# Patient Record
Sex: Female | Born: 1970
Health system: Southern US, Community
[De-identification: ages and names within clinical notes are randomized; demographics above are authoritative.]

## PROBLEM LIST (undated history)

## (undated) DIAGNOSIS — E119 Type 2 diabetes mellitus without complications: Secondary | ICD-10-CM

## (undated) DIAGNOSIS — K8020 Calculus of gallbladder without cholecystitis without obstruction: Secondary | ICD-10-CM

## (undated) DIAGNOSIS — I10 Essential (primary) hypertension: Secondary | ICD-10-CM

## (undated) DIAGNOSIS — G4730 Sleep apnea, unspecified: Secondary | ICD-10-CM

## (undated) DIAGNOSIS — E669 Obesity, unspecified: Secondary | ICD-10-CM

## (undated) DIAGNOSIS — K760 Fatty (change of) liver, not elsewhere classified: Secondary | ICD-10-CM

## (undated) DIAGNOSIS — K219 Gastro-esophageal reflux disease without esophagitis: Secondary | ICD-10-CM

## (undated) DIAGNOSIS — Z803 Family history of malignant neoplasm of breast: Secondary | ICD-10-CM

## (undated) DIAGNOSIS — G4733 Obstructive sleep apnea (adult) (pediatric): Secondary | ICD-10-CM

## (undated) DIAGNOSIS — G43109 Migraine with aura, not intractable, without status migrainosus: Secondary | ICD-10-CM

## (undated) DIAGNOSIS — F419 Anxiety disorder, unspecified: Secondary | ICD-10-CM

## (undated) DIAGNOSIS — J309 Allergic rhinitis, unspecified: Secondary | ICD-10-CM

## (undated) DIAGNOSIS — E7800 Pure hypercholesterolemia, unspecified: Secondary | ICD-10-CM

## (undated) DIAGNOSIS — Z8489 Family history of other specified conditions: Secondary | ICD-10-CM

## (undated) HISTORY — DX: Migraine with aura, not intractable, without status migrainosus: G43.109

## (undated) HISTORY — DX: Obesity, unspecified: E66.9

## (undated) HISTORY — DX: Pure hypercholesterolemia, unspecified: E78.00

## (undated) HISTORY — DX: Type 2 diabetes mellitus without complications: E11.9

## (undated) HISTORY — DX: Family history of malignant neoplasm of breast: Z80.3

## (undated) HISTORY — PX: NASAL SEPTUM SURGERY: SHX37

## (undated) HISTORY — DX: Allergic rhinitis, unspecified: J30.9

## (undated) HISTORY — DX: Obstructive sleep apnea (adult) (pediatric): G47.33

## (undated) HISTORY — DX: Fatty (change of) liver, not elsewhere classified: K76.0

---

## 2002-06-17 ENCOUNTER — Encounter: Payer: Self-pay | Admitting: Family Medicine

## 2002-06-17 ENCOUNTER — Encounter: Admission: RE | Admit: 2002-06-17 | Discharge: 2002-06-17 | Payer: Self-pay | Admitting: Family Medicine

## 2002-07-18 ENCOUNTER — Emergency Department (HOSPITAL_COMMUNITY): Admission: EM | Admit: 2002-07-18 | Discharge: 2002-07-18 | Payer: Self-pay | Admitting: Emergency Medicine

## 2002-07-18 ENCOUNTER — Encounter: Payer: Self-pay | Admitting: Emergency Medicine

## 2003-09-29 ENCOUNTER — Encounter: Admission: RE | Admit: 2003-09-29 | Discharge: 2003-09-29 | Payer: Self-pay | Admitting: Family Medicine

## 2003-10-13 ENCOUNTER — Encounter: Admission: RE | Admit: 2003-10-13 | Discharge: 2003-10-13 | Payer: Self-pay | Admitting: Family Medicine

## 2004-06-21 ENCOUNTER — Other Ambulatory Visit: Admission: RE | Admit: 2004-06-21 | Discharge: 2004-06-21 | Payer: Self-pay | Admitting: Family Medicine

## 2004-07-25 IMAGING — NM NM HEPATO W/GB/PHARM/[PERSON_NAME]
2 series · 12 of 12 positions shown · non-contrast
Comparison: none

CLINICAL DATA: Abdominal pain, particularly right upper quadrant, with nausea.

[gb hepatobiliary · 4.66mm/px · 6 of 22 frames shown (1 of 2)]
[frame 2/22]
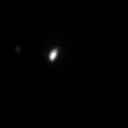
[frame 6/22]
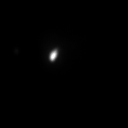
[frame 10/22]
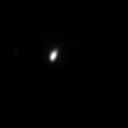
[frame 13/22]
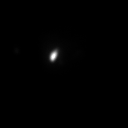
[frame 17/22]
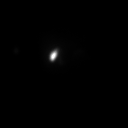
[frame 21/22]
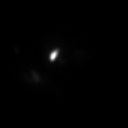

[gb hepatobiliary · 4.66mm/px · 6 of 12 frames shown (2 of 2)]
[frame 2/12]
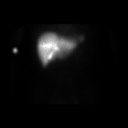
[frame 4/12]
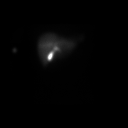
[frame 6/12]
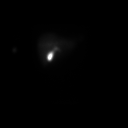
[frame 8/12]
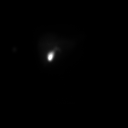
[frame 10/12]
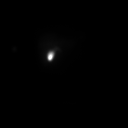
[frame 12/12]
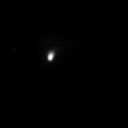

[12 of 12 positions shown; findings below may reference images not displayed]

NUCLEAR MEDICINE HEPATOBILIARY SCAN WITH SCHEDULED EJECTION FRACTION
 The patient was injected with 5.1 millicuries of technetium [id number] intravenously, and imaging over the upper abdomen was performed.

 The radionuclide appears normally throughout the liver.  There is rapid excretion of the radionuclide into the intrahepatic ductal system.  The gallbladder is visualized by 10 minutes after injection.  The patient was give 8 ounces of half-and-half cream orally and imaging over the gallbladder was attempted.  However, the patient became extremely nauseated and did vomit, and that portion of the study was terminated.  

 IMPRESSION
 Normal nuclear medicine hepatobiliary scan.  The gallbladder ejection fraction could not be performed due to the patient?s vomiting during that portion of the study.

 [REDACTED]

## 2005-05-06 ENCOUNTER — Encounter: Admission: RE | Admit: 2005-05-06 | Discharge: 2005-05-06 | Payer: Self-pay | Admitting: Family Medicine

## 2005-05-10 ENCOUNTER — Encounter (HOSPITAL_COMMUNITY): Admission: RE | Admit: 2005-05-10 | Discharge: 2005-08-08 | Payer: Self-pay

## 2005-06-28 ENCOUNTER — Other Ambulatory Visit: Admission: RE | Admit: 2005-06-28 | Discharge: 2005-06-28 | Payer: Self-pay | Admitting: Family Medicine

## 2005-07-19 ENCOUNTER — Encounter: Admission: RE | Admit: 2005-07-19 | Discharge: 2005-07-19 | Payer: Self-pay | Admitting: Family Medicine

## 2005-07-27 ENCOUNTER — Encounter: Admission: RE | Admit: 2005-07-27 | Discharge: 2005-07-27 | Payer: Self-pay | Admitting: Neurology

## 2006-08-18 ENCOUNTER — Other Ambulatory Visit: Admission: RE | Admit: 2006-08-18 | Discharge: 2006-08-18 | Payer: Self-pay | Admitting: Family Medicine

## 2007-08-30 ENCOUNTER — Other Ambulatory Visit: Admission: RE | Admit: 2007-08-30 | Discharge: 2007-08-30 | Payer: Self-pay | Admitting: Family Medicine

## 2007-10-10 ENCOUNTER — Encounter: Admission: RE | Admit: 2007-10-10 | Discharge: 2007-10-10 | Payer: Self-pay | Admitting: Family Medicine

## 2009-02-16 ENCOUNTER — Other Ambulatory Visit: Admission: RE | Admit: 2009-02-16 | Discharge: 2009-02-16 | Payer: Self-pay | Admitting: Family Medicine

## 2009-10-25 ENCOUNTER — Emergency Department (HOSPITAL_COMMUNITY): Admission: EM | Admit: 2009-10-25 | Discharge: 2009-10-25 | Payer: Self-pay | Admitting: Emergency Medicine

## 2010-02-24 ENCOUNTER — Other Ambulatory Visit: Admission: RE | Admit: 2010-02-24 | Discharge: 2010-02-24 | Payer: Self-pay | Admitting: Family Medicine

## 2010-03-09 ENCOUNTER — Encounter: Admission: RE | Admit: 2010-03-09 | Discharge: 2010-03-09 | Payer: Self-pay | Admitting: Family Medicine

## 2010-11-21 ENCOUNTER — Encounter: Payer: Self-pay | Admitting: Family Medicine

## 2012-04-26 ENCOUNTER — Other Ambulatory Visit: Payer: Self-pay | Admitting: Family Medicine

## 2013-01-02 ENCOUNTER — Other Ambulatory Visit (HOSPITAL_COMMUNITY)
Admission: RE | Admit: 2013-01-02 | Discharge: 2013-01-02 | Disposition: A | Discharge status: Home / Self Care | Payer: BC Managed Care – PPO | Source: Ambulatory Visit | Attending: Family Medicine | Admitting: Family Medicine

## 2013-01-02 ENCOUNTER — Other Ambulatory Visit: Payer: Self-pay | Admitting: Family Medicine

## 2013-01-02 DIAGNOSIS — Z0000 Encounter for general adult medical examination without abnormal findings: Secondary | ICD-10-CM | POA: Insufficient documentation

## 2014-01-23 ENCOUNTER — Other Ambulatory Visit: Payer: Self-pay

## 2014-01-23 DIAGNOSIS — Z1231 Encounter for screening mammogram for malignant neoplasm of breast: Secondary | ICD-10-CM

## 2014-01-23 DIAGNOSIS — Z803 Family history of malignant neoplasm of breast: Secondary | ICD-10-CM

## 2014-01-31 ENCOUNTER — Ambulatory Visit
Admission: RE | Admit: 2014-01-31 | Discharge: 2014-01-31 | Disposition: A | Discharge status: Home / Self Care | Payer: BC Managed Care – PPO | Source: Ambulatory Visit

## 2014-01-31 DIAGNOSIS — Z1231 Encounter for screening mammogram for malignant neoplasm of breast: Secondary | ICD-10-CM

## 2014-01-31 DIAGNOSIS — Z803 Family history of malignant neoplasm of breast: Secondary | ICD-10-CM

## 2016-02-22 DIAGNOSIS — Z309 Encounter for contraceptive management, unspecified: Secondary | ICD-10-CM | POA: Diagnosis not present

## 2016-04-29 DIAGNOSIS — G4733 Obstructive sleep apnea (adult) (pediatric): Secondary | ICD-10-CM | POA: Diagnosis not present

## 2016-05-16 DIAGNOSIS — Z309 Encounter for contraceptive management, unspecified: Secondary | ICD-10-CM | POA: Diagnosis not present

## 2016-06-16 ENCOUNTER — Other Ambulatory Visit: Payer: Self-pay | Admitting: Family Medicine

## 2016-06-16 DIAGNOSIS — Z1231 Encounter for screening mammogram for malignant neoplasm of breast: Secondary | ICD-10-CM

## 2016-06-28 ENCOUNTER — Other Ambulatory Visit: Payer: Self-pay | Admitting: Family Medicine

## 2016-06-28 ENCOUNTER — Other Ambulatory Visit (HOSPITAL_COMMUNITY)
Admission: RE | Admit: 2016-06-28 | Discharge: 2016-06-28 | Disposition: A | Discharge status: Home / Self Care | Payer: BLUE CROSS/BLUE SHIELD | Source: Ambulatory Visit | Attending: Family Medicine | Admitting: Family Medicine

## 2016-06-28 DIAGNOSIS — E7800 Pure hypercholesterolemia, unspecified: Secondary | ICD-10-CM | POA: Diagnosis not present

## 2016-06-28 DIAGNOSIS — Z124 Encounter for screening for malignant neoplasm of cervix: Secondary | ICD-10-CM | POA: Diagnosis not present

## 2016-06-28 DIAGNOSIS — E669 Obesity, unspecified: Secondary | ICD-10-CM | POA: Diagnosis not present

## 2016-06-28 DIAGNOSIS — Z01419 Encounter for gynecological examination (general) (routine) without abnormal findings: Secondary | ICD-10-CM | POA: Insufficient documentation

## 2016-06-28 DIAGNOSIS — Z0000 Encounter for general adult medical examination without abnormal findings: Secondary | ICD-10-CM | POA: Diagnosis not present

## 2016-06-28 DIAGNOSIS — I10 Essential (primary) hypertension: Secondary | ICD-10-CM | POA: Diagnosis not present

## 2016-06-29 ENCOUNTER — Ambulatory Visit
Admission: RE | Admit: 2016-06-29 | Discharge: 2016-06-29 | Disposition: A | Discharge status: Home / Self Care | Payer: BLUE CROSS/BLUE SHIELD | Source: Ambulatory Visit | Attending: Family Medicine | Admitting: Family Medicine

## 2016-06-29 DIAGNOSIS — Z1231 Encounter for screening mammogram for malignant neoplasm of breast: Secondary | ICD-10-CM

## 2016-06-30 LAB — CYTOLOGY - PAP

## 2016-08-08 DIAGNOSIS — Z309 Encounter for contraceptive management, unspecified: Secondary | ICD-10-CM | POA: Diagnosis not present

## 2016-10-28 DIAGNOSIS — Z309 Encounter for contraceptive management, unspecified: Secondary | ICD-10-CM | POA: Diagnosis not present

## 2017-01-20 DIAGNOSIS — Z309 Encounter for contraceptive management, unspecified: Secondary | ICD-10-CM | POA: Diagnosis not present

## 2017-04-12 DIAGNOSIS — Z3042 Encounter for surveillance of injectable contraceptive: Secondary | ICD-10-CM | POA: Diagnosis not present

## 2017-06-28 DIAGNOSIS — Z3042 Encounter for surveillance of injectable contraceptive: Secondary | ICD-10-CM | POA: Diagnosis not present

## 2017-06-28 DIAGNOSIS — Z0000 Encounter for general adult medical examination without abnormal findings: Secondary | ICD-10-CM | POA: Diagnosis not present

## 2017-06-28 DIAGNOSIS — E7800 Pure hypercholesterolemia, unspecified: Secondary | ICD-10-CM | POA: Diagnosis not present

## 2017-06-28 DIAGNOSIS — I10 Essential (primary) hypertension: Secondary | ICD-10-CM | POA: Diagnosis not present

## 2017-06-28 DIAGNOSIS — Z309 Encounter for contraceptive management, unspecified: Secondary | ICD-10-CM | POA: Diagnosis not present

## 2017-09-19 DIAGNOSIS — Z3042 Encounter for surveillance of injectable contraceptive: Secondary | ICD-10-CM | POA: Diagnosis not present

## 2017-09-19 DIAGNOSIS — Z309 Encounter for contraceptive management, unspecified: Secondary | ICD-10-CM | POA: Diagnosis not present

## 2017-12-11 DIAGNOSIS — Z3042 Encounter for surveillance of injectable contraceptive: Secondary | ICD-10-CM | POA: Diagnosis not present

## 2018-03-05 DIAGNOSIS — Z3042 Encounter for surveillance of injectable contraceptive: Secondary | ICD-10-CM | POA: Diagnosis not present

## 2018-03-05 DIAGNOSIS — Z309 Encounter for contraceptive management, unspecified: Secondary | ICD-10-CM | POA: Diagnosis not present

## 2018-03-06 DIAGNOSIS — L918 Other hypertrophic disorders of the skin: Secondary | ICD-10-CM | POA: Diagnosis not present

## 2018-04-30 DIAGNOSIS — H6980 Other specified disorders of Eustachian tube, unspecified ear: Secondary | ICD-10-CM | POA: Diagnosis not present

## 2018-06-05 DIAGNOSIS — Z3042 Encounter for surveillance of injectable contraceptive: Secondary | ICD-10-CM | POA: Diagnosis not present

## 2018-08-27 DIAGNOSIS — Z3042 Encounter for surveillance of injectable contraceptive: Secondary | ICD-10-CM | POA: Diagnosis not present

## 2018-10-01 ENCOUNTER — Other Ambulatory Visit: Payer: Self-pay | Admitting: Family Medicine

## 2018-10-01 DIAGNOSIS — Z1231 Encounter for screening mammogram for malignant neoplasm of breast: Secondary | ICD-10-CM

## 2018-11-12 ENCOUNTER — Ambulatory Visit: Payer: Self-pay

## 2018-11-16 DIAGNOSIS — Z3042 Encounter for surveillance of injectable contraceptive: Secondary | ICD-10-CM | POA: Diagnosis not present

## 2018-12-04 ENCOUNTER — Encounter: Payer: Self-pay | Admitting: Radiology

## 2018-12-04 ENCOUNTER — Ambulatory Visit
Admission: RE | Admit: 2018-12-04 | Discharge: 2018-12-04 | Disposition: A | Discharge status: Home / Self Care | Payer: BLUE CROSS/BLUE SHIELD | Source: Ambulatory Visit | Attending: Family Medicine | Admitting: Family Medicine

## 2018-12-04 DIAGNOSIS — Z1231 Encounter for screening mammogram for malignant neoplasm of breast: Secondary | ICD-10-CM | POA: Diagnosis not present

## 2018-12-13 ENCOUNTER — Other Ambulatory Visit: Payer: Self-pay | Admitting: Family Medicine

## 2018-12-13 ENCOUNTER — Other Ambulatory Visit (HOSPITAL_COMMUNITY)
Admission: RE | Admit: 2018-12-13 | Discharge: 2018-12-13 | Disposition: A | Discharge status: Home / Self Care | Payer: BLUE CROSS/BLUE SHIELD | Source: Ambulatory Visit | Attending: Family Medicine | Admitting: Family Medicine

## 2018-12-13 DIAGNOSIS — E7800 Pure hypercholesterolemia, unspecified: Secondary | ICD-10-CM | POA: Diagnosis not present

## 2018-12-13 DIAGNOSIS — Z01419 Encounter for gynecological examination (general) (routine) without abnormal findings: Secondary | ICD-10-CM | POA: Diagnosis not present

## 2018-12-13 DIAGNOSIS — Z23 Encounter for immunization: Secondary | ICD-10-CM | POA: Diagnosis not present

## 2018-12-13 DIAGNOSIS — I10 Essential (primary) hypertension: Secondary | ICD-10-CM | POA: Diagnosis not present

## 2018-12-13 DIAGNOSIS — Z0000 Encounter for general adult medical examination without abnormal findings: Secondary | ICD-10-CM | POA: Diagnosis not present

## 2018-12-13 DIAGNOSIS — Z124 Encounter for screening for malignant neoplasm of cervix: Secondary | ICD-10-CM | POA: Diagnosis not present

## 2018-12-18 LAB — CYTOLOGY - PAP: Diagnosis: NEGATIVE

## 2019-02-07 DIAGNOSIS — Z3042 Encounter for surveillance of injectable contraceptive: Secondary | ICD-10-CM | POA: Diagnosis not present

## 2019-05-02 DIAGNOSIS — Z309 Encounter for contraceptive management, unspecified: Secondary | ICD-10-CM | POA: Diagnosis not present

## 2019-07-24 DIAGNOSIS — Z3042 Encounter for surveillance of injectable contraceptive: Secondary | ICD-10-CM | POA: Diagnosis not present

## 2019-07-24 DIAGNOSIS — Z23 Encounter for immunization: Secondary | ICD-10-CM | POA: Diagnosis not present

## 2019-10-15 DIAGNOSIS — Z3042 Encounter for surveillance of injectable contraceptive: Secondary | ICD-10-CM | POA: Diagnosis not present

## 2019-12-16 ENCOUNTER — Other Ambulatory Visit: Payer: Self-pay | Admitting: Family Medicine

## 2019-12-16 DIAGNOSIS — Z1231 Encounter for screening mammogram for malignant neoplasm of breast: Secondary | ICD-10-CM

## 2019-12-18 DIAGNOSIS — R5383 Other fatigue: Secondary | ICD-10-CM | POA: Diagnosis not present

## 2019-12-18 DIAGNOSIS — E7800 Pure hypercholesterolemia, unspecified: Secondary | ICD-10-CM | POA: Diagnosis not present

## 2019-12-18 DIAGNOSIS — Z0000 Encounter for general adult medical examination without abnormal findings: Secondary | ICD-10-CM | POA: Diagnosis not present

## 2019-12-18 DIAGNOSIS — I10 Essential (primary) hypertension: Secondary | ICD-10-CM | POA: Diagnosis not present

## 2020-01-06 DIAGNOSIS — Z3042 Encounter for surveillance of injectable contraceptive: Secondary | ICD-10-CM | POA: Diagnosis not present

## 2020-01-21 ENCOUNTER — Other Ambulatory Visit: Payer: Self-pay

## 2020-01-21 ENCOUNTER — Ambulatory Visit
Admission: RE | Admit: 2020-01-21 | Discharge: 2020-01-21 | Disposition: A | Discharge status: Home / Self Care | Payer: BC Managed Care – PPO | Source: Ambulatory Visit | Attending: Family Medicine | Admitting: Family Medicine

## 2020-01-21 DIAGNOSIS — Z1231 Encounter for screening mammogram for malignant neoplasm of breast: Secondary | ICD-10-CM

## 2020-01-31 DIAGNOSIS — Z20828 Contact with and (suspected) exposure to other viral communicable diseases: Secondary | ICD-10-CM | POA: Diagnosis not present

## 2020-03-27 DIAGNOSIS — Z3042 Encounter for surveillance of injectable contraceptive: Secondary | ICD-10-CM | POA: Diagnosis not present

## 2020-06-18 DIAGNOSIS — Z3042 Encounter for surveillance of injectable contraceptive: Secondary | ICD-10-CM | POA: Diagnosis not present

## 2020-09-09 DIAGNOSIS — Z3042 Encounter for surveillance of injectable contraceptive: Secondary | ICD-10-CM | POA: Diagnosis not present

## 2020-12-01 DIAGNOSIS — Z309 Encounter for contraceptive management, unspecified: Secondary | ICD-10-CM | POA: Diagnosis not present

## 2020-12-01 DIAGNOSIS — Z3042 Encounter for surveillance of injectable contraceptive: Secondary | ICD-10-CM | POA: Diagnosis not present

## 2020-12-11 DIAGNOSIS — B029 Zoster without complications: Secondary | ICD-10-CM | POA: Diagnosis not present

## 2020-12-18 DIAGNOSIS — Z23 Encounter for immunization: Secondary | ICD-10-CM | POA: Diagnosis not present

## 2020-12-18 DIAGNOSIS — Z0000 Encounter for general adult medical examination without abnormal findings: Secondary | ICD-10-CM | POA: Diagnosis not present

## 2020-12-18 DIAGNOSIS — I10 Essential (primary) hypertension: Secondary | ICD-10-CM | POA: Diagnosis not present

## 2020-12-18 DIAGNOSIS — E7800 Pure hypercholesterolemia, unspecified: Secondary | ICD-10-CM | POA: Diagnosis not present

## 2021-02-02 ENCOUNTER — Other Ambulatory Visit: Payer: Self-pay | Admitting: Family Medicine

## 2021-02-02 DIAGNOSIS — Z1231 Encounter for screening mammogram for malignant neoplasm of breast: Secondary | ICD-10-CM

## 2021-02-22 DIAGNOSIS — Z3042 Encounter for surveillance of injectable contraceptive: Secondary | ICD-10-CM | POA: Diagnosis not present

## 2021-02-25 DIAGNOSIS — Z1211 Encounter for screening for malignant neoplasm of colon: Secondary | ICD-10-CM | POA: Diagnosis not present

## 2021-03-25 ENCOUNTER — Ambulatory Visit: Payer: BC Managed Care – PPO

## 2021-04-08 ENCOUNTER — Other Ambulatory Visit: Payer: Self-pay

## 2021-04-08 ENCOUNTER — Ambulatory Visit
Admission: RE | Admit: 2021-04-08 | Discharge: 2021-04-08 | Disposition: A | Discharge status: Home / Self Care | Payer: BC Managed Care – PPO | Source: Ambulatory Visit | Attending: Family Medicine | Admitting: Family Medicine

## 2021-04-08 DIAGNOSIS — Z1231 Encounter for screening mammogram for malignant neoplasm of breast: Secondary | ICD-10-CM | POA: Diagnosis not present

## 2021-05-14 DIAGNOSIS — Z3042 Encounter for surveillance of injectable contraceptive: Secondary | ICD-10-CM | POA: Diagnosis not present

## 2021-06-01 ENCOUNTER — Telehealth: Payer: Self-pay | Admitting: Genetic Counselor

## 2021-06-01 NOTE — Telephone Encounter (Signed)
Returned Kelsey Daniels's phone call to schedule a genetic counseling appointment. Scheduled appointment for Wednesday 06/02/21 at 11am.

## 2021-06-02 ENCOUNTER — Other Ambulatory Visit: Payer: Self-pay

## 2021-06-02 ENCOUNTER — Inpatient Hospital Stay: Payer: BC Managed Care – PPO | Attending: Genetic Counselor | Admitting: Genetic Counselor

## 2021-06-02 ENCOUNTER — Inpatient Hospital Stay: Payer: BC Managed Care – PPO

## 2021-06-02 DIAGNOSIS — Z803 Family history of malignant neoplasm of breast: Secondary | ICD-10-CM | POA: Diagnosis not present

## 2021-06-02 DIAGNOSIS — Z8481 Family history of carrier of genetic disease: Secondary | ICD-10-CM

## 2021-06-02 LAB — GENETIC SCREENING ORDER

## 2021-06-03 ENCOUNTER — Encounter: Payer: Self-pay | Admitting: Genetic Counselor

## 2021-06-03 DIAGNOSIS — Z8481 Family history of carrier of genetic disease: Secondary | ICD-10-CM | POA: Insufficient documentation

## 2021-06-03 DIAGNOSIS — Z803 Family history of malignant neoplasm of breast: Secondary | ICD-10-CM | POA: Insufficient documentation

## 2021-06-03 NOTE — Progress Notes (Signed)
REFERRING PROVIDER: Gaynelle Arabian, MD [address] [place],  [place] [address]  PRIMARY PROVIDER:  Gaynelle Arabian, MD  PRIMARY REASON FOR VISIT:  1. Family history of breast cancer   2. Family history of BRCA2 gene positive      HISTORY OF PRESENT ILLNESS:   Kelsey Daniels, a 50 y.o. female, was seen for a [facility] cancer genetics consultation at the request of Dr. Marisue Humble due to a family history of breast cancer and a known familial mutation in BRCA2.  Ms. Glauber presents to clinic today to discuss the possibility of a hereditary predisposition to cancer, genetic testing, and to further clarify her future cancer risks, as well as potential cancer risks for family members.   Ms. Prosser is a 50 y.o. female with no personal history of cancer.    CANCER HISTORY:  Oncology History   No history exists.     RISK FACTORS:  Menarche was at age 24.  First live birth at age N/A.  OCP use for approximately 4 years.  Ovaries intact: yes.  Hysterectomy: no.  Menopausal status: perimenopausal.  HRT use: 0 years. Colonoscopy: no; not examined. Mammogram within the last year: yes. Number of breast biopsies: 0. Up to date with pelvic exams: yes. Any excessive radiation exposure in the past: no  Past Medical History:  Diagnosis Date   Family history of breast cancer     No past surgical history on file.  Social History   Socioeconomic History   Marital status: Married    Spouse name: Not on file   Number of children: Not on file   Years of education: Not on file   Highest education level: Not on file  Occupational History   Not on file  Tobacco Use   Smoking status: Not on file   Smokeless tobacco: Not on file  Substance and Sexual Activity   Alcohol use: Not on file   Drug use: Not on file   Sexual activity: Not on file  Other Topics Concern   Not on file  Social History Narrative   Not on file   Social Determinants of Health   Financial  Resource Strain: Not on file  Food Insecurity: Not on file  Transportation Needs: Not on file  Physical Activity: Not on file  Stress: Not on file  Social Connections: Not on file     FAMILY HISTORY:  We obtained a detailed, 4-generation family history.  Significant diagnoses are listed below: Family History  Problem Relation Age of Onset   Breast cancer Sister 38   Breast cancer Sister 30   BRCA 1/2 Sister        BRCA2+   Breast cancer Paternal Aunt    Breast cancer Maternal Grandmother    Breast cancer Paternal Grandmother     The patient does not have children. She has one brother and seven sisters.  Two sisters had breast cancer at age 24 and 47 and are deceased.  Two additional sisters have undergone genetic testing and were identified to have a BRCA2 mutation.   Both parents are deceased.  The patient's mother had a brother and sister.  The brother had liver cancer.  The patient's maternal grandmother had breast cancer at an unknown age.  The patient's father is deceased.  He had two sisters and multiple brothers.  The family was not close so no details are known, but she thinks one aunt had breast cancer.  The paternal grandmother had breast cancer  as well.  Ms. Mogle is aware of previous family history of genetic testing for hereditary cancer risks. Patient's maternal ancestors are of Caucasian descent, and paternal ancestors are of Caucasian descent. There is no reported Ashkenazi Jewish ancestry. There is no known consanguinity.  GENETIC COUNSELING ASSESSMENT: Ms. Lykins is a 50 y.o. female with a family history of cancer and a known BRCA2 mutation running in the family. We, therefore, discussed and recommended the following at today's visit.   DISCUSSION: We discussed that 5 - 10% of breast is hereditary, with most cases associated with BRCA mutations.  She has two sisters who are positive for a hereditary BRCA2 mutation.  Therefore, Ms. Yam has a 50% chance of  having inherited this same mutation.  We discussed that if she inherited this mutation then we would change her medical management in order to either reduce her risk for cancer or in a way to allow [place] to identify a cancer early when it is more treatable.  She voiced her understanding.    We reviewed the characteristics, features and inheritance patterns of hereditary cancer syndromes. We also discussed genetic testing, including the appropriate family members to test, the process of testing, insurance coverage and turn-around-time for results. We discussed the implications of a negative, positive, carrier and/or variant of uncertain significant result. We recommended Ms. Mcmartin pursue genetic testing for the CancerNext-Expanded+RNAinsight gene panel. The CancerNext-Expanded gene panel offered by [facility] and includes sequencing and rearrangement analysis for the following 77 genes: AIP, ALK, APC*, ATM*, AXIN2, BAP1, BARD1, BLM, BMPR1A, BRCA1*, BRCA2*, BRIP1*, CDC73, CDH1*, CDK4, CDKN1B, CDKN2A, CHEK2*, CTNNA1, DICER1, FANCC, FH, FLCN, GALNT12, KIF1B, LZTR1, MAX, MEN1, MET, MLH1*, MSH2*, MSH3, MSH6*, MUTYH*, NBN, NF1*, NF2, NTHL1, PALB2*, PHOX2B, PMS2*, POT1, PRKAR1A, PTCH1, PTEN*, RAD51C*, RAD51D*, RB1, RECQL, RET, SDHA, SDHAF2, SDHB, SDHC, SDHD, SMAD4, SMARCA4, SMARCB1, SMARCE1, STK11, SUFU, TMEM127, TP53*, TSC1, TSC2, VHL and XRCC2 (sequencing and deletion/duplication); EGFR, EGLN1, HOXB13, KIT, MITF, PDGFRA, POLD1, and POLE (sequencing only); EPCAM and GREM1 (deletion/duplication only). DNA and RNA analyses performed for * genes.   Based on Ms. Pranger's family history of cancer, she meets medical criteria for genetic testing. Despite that she meets criteria, she may still have an out of pocket cost. We discussed that if her out of pocket cost for testing is over $100, the laboratory will call and confirm whether she wants to proceed with testing.  If the out of pocket cost of testing is less than  $100 she will be billed by the genetic testing laboratory.   PLAN: After considering the risks, benefits, and limitations, Ms. Herdt provided informed consent to pursue genetic testing and the blood sample was sent to [facility] for analysis of the CancerNext-Expanded+RNAinsight panel. Results should be available within approximately 2-3 weeks' time, at which point they will be disclosed by telephone to Ms. Ciani, as will any additional recommendations warranted by these results. Ms. Sando will receive a summary of her genetic counseling visit and a copy of her results once available. This information will also be available in Epic.   Lastly, we encouraged Ms. Hardebeck to remain in contact with cancer genetics annually so that we can continuously update the family history and inform her of any changes in cancer genetics and testing that may be of benefit for this family.   Ms. Dibbern questions were answered to her satisfaction today. Our contact information was provided should additional questions or concerns arise. Thank you for the referral and allowing [place] to  share in the care of your patient.   Shellene Sweigert P. Florene Glen, [place], [facility] Licensed, Insurance risk surveyor [email or web address] phone: [phone]  The patient was seen for a total of 30 minutes in face-to-face genetic counseling.  The patient was seen alone.  This patient was discussed with Drs. Magrinat, Lindi Adie and/or Burr Medico who agrees with the above.    _______________________________________________________________________ For Office Staff:  Number of people involved in session: 2 Was an Intern/ student involved with case: no

## 2021-06-28 ENCOUNTER — Ambulatory Visit: Payer: Self-pay | Admitting: Genetic Counselor

## 2021-06-28 ENCOUNTER — Telehealth: Payer: Self-pay | Admitting: Genetic Counselor

## 2021-06-28 DIAGNOSIS — Z1379 Encounter for other screening for genetic and chromosomal anomalies: Secondary | ICD-10-CM | POA: Insufficient documentation

## 2021-06-28 DIAGNOSIS — Z803 Family history of malignant neoplasm of breast: Secondary | ICD-10-CM

## 2021-06-28 DIAGNOSIS — Z8481 Family history of carrier of genetic disease: Secondary | ICD-10-CM

## 2021-06-28 NOTE — Telephone Encounter (Signed)
Revealed that patient does not have the BRCA2 pathogenic mutation identified in her family.  This is a true negative result.  We would not recommend increased screening for breast cancer.   

## 2021-06-28 NOTE — Progress Notes (Signed)
HPI:  Ms. Dubach was previously seen in the [facility] clinic due to a family history of breast cancer and concerns regarding a hereditary predisposition to cancer due to a known BRCA2 mutation in the family. Please refer to our prior cancer genetics clinic note for more information regarding our discussion, assessment and recommendations, at the time. Ms. Goughnour recent genetic test results were disclosed to her, as were recommendations warranted by these results. These results and recommendations are discussed in more detail below.  CANCER HISTORY:  Oncology History   No history exists.    FAMILY HISTORY:  We obtained a detailed, 4-generation family history.  Significant diagnoses are listed below: Family History  Problem Relation Age of Onset   Breast cancer Sister 67   Breast cancer Sister 17   BRCA 1/2 Sister        BRCA2+   Breast cancer Paternal Aunt    Breast cancer Maternal Grandmother    Breast cancer Paternal Grandmother     The patient does not have children. She has one brother and seven sisters.  Two sisters had breast cancer at age 73 and 49 and are deceased.  Two additional sisters have undergone genetic testing and were identified to have a BRCA2 mutation.   Both parents are deceased.   The patient's mother had a brother and sister.  The brother had liver cancer.  The patient's maternal grandmother had breast cancer at an unknown age.   The patient's father is deceased.  He had two sisters and multiple brothers.  The family was not close so no details are known, but she thinks one aunt had breast cancer.  The paternal grandmother had breast cancer as well.   Ms. Desaulniers is aware of previous family history of genetic testing for hereditary cancer risks. Patient's maternal ancestors are of Caucasian descent, and paternal ancestors are of Caucasian descent. There is no reported Ashkenazi Jewish ancestry. There is no known consanguinity.  GENETIC  TEST RESULTS: Genetic testing reported out on June 23, 2021 through the CancerNext-Expanded+RNAinsight cancer panel found no pathogenic mutations. Ms. Stamour test was normal and did not reveal the familial mutation. We call this result a true negative result because the cancer-causing mutation was identified in Ms. Dennin's family, and she did not inherit it.  Given this negative result, Ms. Odonnel chances of developing BRCA2-related cancers are the same as they are in the general population.  The CancerNext-Expanded gene panel offered by [facility] and includes sequencing and rearrangement analysis for the following 77 genes: AIP, ALK, APC*, ATM*, AXIN2, BAP1, BARD1, BLM, BMPR1A, BRCA1*, BRCA2*, BRIP1*, CDC73, CDH1*, CDK4, CDKN1B, CDKN2A, CHEK2*, CTNNA1, DICER1, FANCC, FH, FLCN, GALNT12, KIF1B, LZTR1, MAX, MEN1, MET, MLH1*, MSH2*, MSH3, MSH6*, MUTYH*, NBN, NF1*, NF2, NTHL1, PALB2*, PHOX2B, PMS2*, POT1, PRKAR1A, PTCH1, PTEN*, RAD51C*, RAD51D*, RB1, RECQL, RET, SDHA, SDHAF2, SDHB, SDHC, SDHD, SMAD4, SMARCA4, SMARCB1, SMARCE1, STK11, SUFU, TMEM127, TP53*, TSC1, TSC2, VHL and XRCC2 (sequencing and deletion/duplication); EGFR, EGLN1, HOXB13, KIT, MITF, PDGFRA, POLD1, and POLE (sequencing only); EPCAM and GREM1 (deletion/duplication only). DNA and RNA analyses performed for * genes. The test report has been scanned into EPIC and is located under the Molecular Pathology section of the Results Review tab.  A portion of the result report is included below for reference.     We discussed with Ms. Capp that because current genetic testing is not perfect, it is possible there may be a gene mutation in one of these genes that current testing cannot  detect, but that chance is small.  We also discussed, that there could be another gene that has not yet been discovered, or that we have not yet tested, that is responsible for the cancer diagnoses in the family. It is also possible there is a hereditary  cause for the cancer in the family that Ms. Pittinger did not inherit and therefore was not identified in her testing.  Therefore, it is important to remain in touch with cancer genetics in the future so that we can continue to offer Ms. Desmarais the most up to date genetic testing.   ADDITIONAL GENETIC TESTING: We discussed with Ms. Spragg that her genetic testing was fairly extensive.  If there are genes identified to increase cancer risk that can be analyzed in the future, we would be happy to discuss and coordinate this testing at that time.    CANCER SCREENING RECOMMENDATIONS: Ms. Kulick test result is considered negative (normal).  This means that she has not inherited the familial mutation in BRCA2.  While reassuring, this does not definitively rule out a hereditary predisposition to cancer. It is still possible that there could be genetic mutations that are undetectable by current technology. There could be genetic mutations in genes that have not been tested or identified to increase cancer risk.  Therefore, it is recommended she continue to follow the cancer management and screening guidelines provided by her primary healthcare provider.   An individual's cancer risk and medical management are not determined by genetic test results alone. Overall cancer risk assessment incorporates additional factors, including personal medical history, family history, and any available genetic information that may result in a personalized plan for cancer prevention and surveillance  RECOMMENDATIONS FOR FAMILY MEMBERS:  Individuals in this family might be at some increased risk of developing cancer, over the general population risk, simply due to the family history of cancer.  We recommended women in this family have a yearly mammogram beginning at age 31, or 75 years younger than the earliest onset of cancer, an annual clinical breast exam, and perform monthly breast self-exams. Women in this family  should also have a gynecological exam as recommended by their primary provider. All family members should be referred for colonoscopy starting at age 15.  FOLLOW-UP: Lastly, we discussed with Ms. Tristan that cancer genetics is a rapidly advancing field and it is possible that new genetic tests will be appropriate for her and/or her family members in the future. We encouraged her to remain in contact with cancer genetics on an annual basis so we can update her personal and family histories and let her know of advances in cancer genetics that may benefit this family.   Our contact number was provided. Ms. Rolston questions were answered to her satisfaction, and she knows she is welcome to call us at anytime with additional questions or concerns.   Roma Kayser, [place], [facility] Licensed, Certified Genetic Counselor [email or web address]

## 2021-08-13 DIAGNOSIS — Z3042 Encounter for surveillance of injectable contraceptive: Secondary | ICD-10-CM | POA: Diagnosis not present

## 2021-10-17 ENCOUNTER — Ambulatory Visit: Admit: 2021-10-17 | Discharge status: Absent without leave | Payer: Self-pay | Source: Home / Self Care

## 2021-10-17 ENCOUNTER — Encounter: Payer: Self-pay | Admitting: Emergency Medicine

## 2021-10-17 ENCOUNTER — Ambulatory Visit
Admission: EM | Admit: 2021-10-17 | Discharge: 2021-10-17 | Disposition: A | Discharge status: Home / Self Care | Payer: BC Managed Care – PPO | Attending: Internal Medicine | Admitting: Internal Medicine

## 2021-10-17 ENCOUNTER — Other Ambulatory Visit: Payer: Self-pay

## 2021-10-17 DIAGNOSIS — R6889 Other general symptoms and signs: Secondary | ICD-10-CM

## 2021-10-17 DIAGNOSIS — J029 Acute pharyngitis, unspecified: Secondary | ICD-10-CM | POA: Diagnosis not present

## 2021-10-17 DIAGNOSIS — J069 Acute upper respiratory infection, unspecified: Secondary | ICD-10-CM | POA: Diagnosis not present

## 2021-10-17 LAB — POCT RAPID STREP A (OFFICE): Rapid Strep A Screen: NEGATIVE

## 2021-10-17 MED ORDER — ACETAMINOPHEN 325 MG PO TABS
650.0000 mg | ORAL_TABLET | Freq: Once | ORAL | Status: AC
  Administered 2021-10-17: 16:00:00 650 mg via ORAL

## 2021-10-17 MED ORDER — OSELTAMIVIR PHOSPHATE 75 MG PO CAPS: 75.0000 mg | ORAL_CAPSULE | Freq: Two times a day (BID) | ORAL | 0 refills | Status: DC

## 2021-10-17 NOTE — Discharge Instructions (Signed)
It appears that you have a viral respiratory infection.  Highly suspicious of the flu given your fever and symptoms.  You have been prescribed Tamiflu to treat this.  Rapid strep test was negative.  Throat culture, COVID-19, flu swab is pending.

## 2021-10-17 NOTE — ED Triage Notes (Signed)
Patient c/o productive cough, fever, body aches, no nasal congestion, headache x 2 days.  Patient has been taken Sudafed Sinus and Ibuprofen.  Home COVID test was negative.  Patient is vaccinated for COVID.

## 2021-10-17 NOTE — ED Provider Notes (Signed)
[facility]    CSN: [id number] Arrival date & time: 10/17/21  1432      History   Chief Complaint Chief Complaint  Patient presents with   Fever    HPI Kelsey Daniels is a 50 y.o. female.   Patient presents with cough, fever, body aches, nasal congestion, sore throat, headache that has been present for approximately 2 days.  Patient reports T-max at home was 101.7.  Patient has taken Sudafed and ibuprofen for symptoms.  Denies any known sick contacts.  Denies chest pain, shortness of breath, ear pain, nausea, vomiting, diarrhea, abdominal pain.   Fever  Past Medical History:  Diagnosis Date   Family history of breast cancer     Patient Active Problem List   Diagnosis Date Noted   Genetic testing 06/28/2021   Family history of breast cancer 06/03/2021   Family history of BRCA2 gene positive 06/03/2021    History reviewed. No pertinent surgical history.  OB History   No obstetric history on file.      Home Medications    Prior to Admission medications   Medication Sig Start Date End Date Taking? Authorizing Provider  atenolol (TENORMIN) 50 MG tablet 1 tablet   Yes [provider]  FLUoxetine (PROZAC) 40 MG capsule 1 capsule   Yes [provider]  medroxyPROGESTERone Acetate 150 MG/ML SUSY USE AS DIRECTED TO  INJECT  INTRAMUSCULARLY  EVERY  3  MONTHS   Yes [provider]  omeprazole (PRILOSEC OTC) 20 MG tablet 1 tablet 08/03/09  Yes [provider]  oseltamivir (TAMIFLU) 75 MG capsule Take 1 capsule (75 mg total) by mouth every 12 (twelve) hours. 10/17/21  Yes Seiya Silsby, Michele Rockers, FNP  triamterene-hydrochlorothiazide (MAXZIDE-25) 37.5-25 MG tablet 1 tablet   Yes [provider]    Family History Family History  Problem Relation Age of Onset   Breast cancer Sister 41   Breast cancer Sister 54   BRCA 1/2 Sister        BRCA2+   Breast cancer Paternal Aunt    Breast cancer Maternal Grandmother     Breast cancer Paternal Grandmother     Social History Social History   Tobacco Use   Smoking status: Never   Smokeless tobacco: Never  Substance Use Topics   Alcohol use: Never   Drug use: Never     Allergies   Neomycin and Sulfa antibiotics   Review of Systems Review of Systems Per HPI  Physical Exam Triage Vital Signs ED Triage Vitals [10/17/21 1558]  Enc Vitals Group     BP 138/83     Pulse Rate 100     Resp (!) 22     Temp (!) 101.6 F (38.7 C)     Temp Source Oral     SpO2 93 %     Weight 230 lb (104.3 kg)     Height _0  (1.626 m)     Head Circumference      Peak Flow      Pain Score 5     Pain Loc      Pain Edu?      Excl. in [place]?    No data found.  Updated Vital Signs BP 138/83 (BP Location: Right Arm)    Pulse 100    Temp (!) 101.6 F (38.7 C) (Oral)    Resp 18    Ht _1  (1.626 m)    Wt 230 lb (104.3 kg)    SpO2  98%    BMI 39.48 kg/m   Visual Acuity Right Eye Distance:   Left Eye Distance:   Bilateral Distance:    Right Eye Near:   Left Eye Near:    Bilateral Near:     Physical Exam Constitutional:      General: She is not in acute distress.    Appearance: Normal appearance. She is not toxic-appearing or diaphoretic.  HENT:     Head: Normocephalic and atraumatic.     Right Ear: Tympanic membrane and ear canal normal.     Left Ear: Tympanic membrane and ear canal normal.     Nose: Congestion present.     Mouth/Throat:     Mouth: Mucous membranes are moist.     Pharynx: Posterior oropharyngeal erythema present.  Eyes:     Extraocular Movements: Extraocular movements intact.     Conjunctiva/sclera: Conjunctivae normal.     Pupils: Pupils are equal, round, and reactive to light.  Cardiovascular:     Rate and Rhythm: Normal rate and regular rhythm.     Pulses: Normal pulses.     Heart sounds: Normal heart sounds.  Pulmonary:     Effort: Pulmonary effort is normal. No respiratory distress.     Breath sounds: Normal breath sounds.  No stridor. No wheezing, rhonchi or rales.  Abdominal:     General: Abdomen is flat. Bowel sounds are normal.     Palpations: Abdomen is soft.  Musculoskeletal:        General: Normal range of motion.     Cervical back: Normal range of motion.  Skin:    General: Skin is warm and dry.  Neurological:     General: No focal deficit present.     Mental Status: She is alert and oriented to person, place, and time. Mental status is at baseline.  Psychiatric:        Mood and Affect: Mood normal.        Behavior: Behavior normal.     UC Treatments / Results  Labs (all labs ordered are listed, but only abnormal results are displayed) Labs Reviewed  CULTURE, GROUP A STREP ([place])  COVID-19, FLU A+B NAA  POCT RAPID STREP A (OFFICE)    EKG   Radiology No results found.  Procedures Procedures (including critical care time)  Medications Ordered in UC Medications  acetaminophen (TYLENOL) tablet 650 mg (650 mg Oral Given 10/17/21 1556)    Initial Impression / Assessment and Plan / UC Course  I have reviewed the triage vital signs and the nursing notes.  Pertinent labs & imaging results that were available during my care of the patient were reviewed by me and considered in my medical decision making (see chart for details).     Patient presents with symptoms likely from a viral upper respiratory infection. Differential includes bacterial pneumonia, sinusitis, allergic rhinitis, COVID-19, flu. Do not suspect underlying cardiopulmonary process. Symptoms seem unlikely related to ACS, CHF or COPD exacerbations, pneumonia, pneumothorax. Patient is nontoxic appearing and not in need of emergent medical intervention.  Recommended symptom control with over the counter medications.  Discussed symptom management and supportive care.  Fever monitoring and management discussed with patient.  Acetaminophen administered in urgent care today for fever.  Highly suspicious of the flu so will opt to  treat with Tamiflu.  Return if symptoms fail to improve in 1-2 weeks or you develop shortness of breath, chest pain, severe headache. Patient states understanding and is agreeable.  Discharged with PCP followup.  Final Clinical Impressions(s) / UC Diagnoses   Final diagnoses:  Viral upper respiratory tract infection with cough  Flu-like symptoms  Sore throat     Discharge Instructions      It appears that you have a viral respiratory infection.  Highly suspicious of the flu given your fever and symptoms.  You have been prescribed Tamiflu to treat this.  Rapid strep test was negative.  Throat culture, COVID-19, flu swab is pending.    ED Prescriptions     Medication Sig Dispense Auth. Provider   oseltamivir (TAMIFLU) 75 MG capsule Take 1 capsule (75 mg total) by mouth every 12 (twelve) hours. 10 capsule Teodora Medici, [place]      PDMP not reviewed this encounter.   Teodora Medici, [place] 10/17/21 [phone]

## 2021-10-19 LAB — COVID-19, FLU A+B NAA
Influenza A, NAA: NOT DETECTED
Influenza B, NAA: NOT DETECTED
SARS-CoV-2, NAA: NOT DETECTED

## 2021-10-20 LAB — CULTURE, GROUP A STREP (THRC)

## 2021-11-04 DIAGNOSIS — Z3042 Encounter for surveillance of injectable contraceptive: Secondary | ICD-10-CM | POA: Diagnosis not present

## 2021-11-11 DIAGNOSIS — R059 Cough, unspecified: Secondary | ICD-10-CM | POA: Diagnosis not present

## 2021-11-11 DIAGNOSIS — R0789 Other chest pain: Secondary | ICD-10-CM | POA: Diagnosis not present

## 2021-12-24 DIAGNOSIS — Z0000 Encounter for general adult medical examination without abnormal findings: Secondary | ICD-10-CM | POA: Diagnosis not present

## 2021-12-24 DIAGNOSIS — E7800 Pure hypercholesterolemia, unspecified: Secondary | ICD-10-CM | POA: Diagnosis not present

## 2021-12-24 DIAGNOSIS — Z124 Encounter for screening for malignant neoplasm of cervix: Secondary | ICD-10-CM | POA: Diagnosis not present

## 2021-12-24 DIAGNOSIS — I10 Essential (primary) hypertension: Secondary | ICD-10-CM | POA: Diagnosis not present

## 2022-01-25 DIAGNOSIS — G4733 Obstructive sleep apnea (adult) (pediatric): Secondary | ICD-10-CM | POA: Diagnosis not present

## 2022-01-26 DIAGNOSIS — Z3042 Encounter for surveillance of injectable contraceptive: Secondary | ICD-10-CM | POA: Diagnosis not present

## 2022-02-03 DIAGNOSIS — Z1211 Encounter for screening for malignant neoplasm of colon: Secondary | ICD-10-CM | POA: Diagnosis not present

## 2022-02-25 DIAGNOSIS — G4733 Obstructive sleep apnea (adult) (pediatric): Secondary | ICD-10-CM | POA: Diagnosis not present

## 2022-03-27 DIAGNOSIS — G4733 Obstructive sleep apnea (adult) (pediatric): Secondary | ICD-10-CM | POA: Diagnosis not present

## 2022-04-19 DIAGNOSIS — Z3042 Encounter for surveillance of injectable contraceptive: Secondary | ICD-10-CM | POA: Diagnosis not present

## 2022-04-27 DIAGNOSIS — G4733 Obstructive sleep apnea (adult) (pediatric): Secondary | ICD-10-CM | POA: Diagnosis not present

## 2022-05-25 DIAGNOSIS — L918 Other hypertrophic disorders of the skin: Secondary | ICD-10-CM | POA: Diagnosis not present

## 2022-05-25 DIAGNOSIS — B078 Other viral warts: Secondary | ICD-10-CM | POA: Diagnosis not present

## 2022-05-27 DIAGNOSIS — G4733 Obstructive sleep apnea (adult) (pediatric): Secondary | ICD-10-CM | POA: Diagnosis not present

## 2022-06-27 DIAGNOSIS — G4733 Obstructive sleep apnea (adult) (pediatric): Secondary | ICD-10-CM | POA: Diagnosis not present

## 2022-07-06 DIAGNOSIS — Z309 Encounter for contraceptive management, unspecified: Secondary | ICD-10-CM | POA: Diagnosis not present

## 2022-07-06 DIAGNOSIS — Z3042 Encounter for surveillance of injectable contraceptive: Secondary | ICD-10-CM | POA: Diagnosis not present

## 2022-07-11 DIAGNOSIS — L918 Other hypertrophic disorders of the skin: Secondary | ICD-10-CM | POA: Diagnosis not present

## 2022-07-28 DIAGNOSIS — G4733 Obstructive sleep apnea (adult) (pediatric): Secondary | ICD-10-CM | POA: Diagnosis not present

## 2022-08-27 DIAGNOSIS — G4733 Obstructive sleep apnea (adult) (pediatric): Secondary | ICD-10-CM | POA: Diagnosis not present

## 2022-09-27 ENCOUNTER — Other Ambulatory Visit (HOSPITAL_BASED_OUTPATIENT_CLINIC_OR_DEPARTMENT_OTHER): Payer: Self-pay | Admitting: Family Medicine

## 2022-09-27 DIAGNOSIS — Z1231 Encounter for screening mammogram for malignant neoplasm of breast: Secondary | ICD-10-CM

## 2022-09-27 DIAGNOSIS — Z3042 Encounter for surveillance of injectable contraceptive: Secondary | ICD-10-CM | POA: Diagnosis not present

## 2022-09-27 DIAGNOSIS — G4733 Obstructive sleep apnea (adult) (pediatric): Secondary | ICD-10-CM | POA: Diagnosis not present

## 2022-09-29 ENCOUNTER — Ambulatory Visit (HOSPITAL_BASED_OUTPATIENT_CLINIC_OR_DEPARTMENT_OTHER)
Admission: RE | Admit: 2022-09-29 | Discharge: 2022-09-29 | Disposition: A | Discharge status: Home / Self Care | Payer: BC Managed Care – PPO | Source: Ambulatory Visit | Attending: Family Medicine | Admitting: Family Medicine

## 2022-09-29 DIAGNOSIS — Z1231 Encounter for screening mammogram for malignant neoplasm of breast: Secondary | ICD-10-CM | POA: Diagnosis not present

## 2022-10-12 DIAGNOSIS — R1013 Epigastric pain: Secondary | ICD-10-CM | POA: Diagnosis not present

## 2022-10-12 DIAGNOSIS — R197 Diarrhea, unspecified: Secondary | ICD-10-CM | POA: Diagnosis not present

## 2022-10-12 DIAGNOSIS — K219 Gastro-esophageal reflux disease without esophagitis: Secondary | ICD-10-CM | POA: Diagnosis not present

## 2022-10-12 DIAGNOSIS — R739 Hyperglycemia, unspecified: Secondary | ICD-10-CM | POA: Diagnosis not present

## 2022-10-13 ENCOUNTER — Other Ambulatory Visit: Payer: Self-pay | Admitting: Physician Assistant

## 2022-10-13 DIAGNOSIS — R1013 Epigastric pain: Secondary | ICD-10-CM

## 2022-10-14 DIAGNOSIS — L918 Other hypertrophic disorders of the skin: Secondary | ICD-10-CM | POA: Diagnosis not present

## 2022-10-27 ENCOUNTER — Ambulatory Visit
Admission: RE | Admit: 2022-10-27 | Discharge: 2022-10-27 | Disposition: A | Discharge status: Home / Self Care | Payer: BC Managed Care – PPO | Source: Ambulatory Visit | Attending: Physician Assistant | Admitting: Physician Assistant

## 2022-10-27 DIAGNOSIS — K828 Other specified diseases of gallbladder: Secondary | ICD-10-CM | POA: Diagnosis not present

## 2022-10-27 DIAGNOSIS — R1013 Epigastric pain: Secondary | ICD-10-CM

## 2022-10-27 DIAGNOSIS — G4733 Obstructive sleep apnea (adult) (pediatric): Secondary | ICD-10-CM | POA: Diagnosis not present

## 2022-10-27 DIAGNOSIS — K760 Fatty (change of) liver, not elsewhere classified: Secondary | ICD-10-CM | POA: Diagnosis not present

## 2022-10-27 DIAGNOSIS — K8020 Calculus of gallbladder without cholecystitis without obstruction: Secondary | ICD-10-CM | POA: Diagnosis not present

## 2022-11-16 DIAGNOSIS — E1169 Type 2 diabetes mellitus with other specified complication: Secondary | ICD-10-CM | POA: Diagnosis not present

## 2022-11-16 DIAGNOSIS — E669 Obesity, unspecified: Secondary | ICD-10-CM | POA: Diagnosis not present

## 2022-11-16 DIAGNOSIS — E7800 Pure hypercholesterolemia, unspecified: Secondary | ICD-10-CM | POA: Diagnosis not present

## 2022-11-18 DIAGNOSIS — I10 Essential (primary) hypertension: Secondary | ICD-10-CM | POA: Diagnosis not present

## 2022-11-18 DIAGNOSIS — E1169 Type 2 diabetes mellitus with other specified complication: Secondary | ICD-10-CM | POA: Diagnosis not present

## 2022-11-18 DIAGNOSIS — E7800 Pure hypercholesterolemia, unspecified: Secondary | ICD-10-CM | POA: Diagnosis not present

## 2022-11-18 DIAGNOSIS — G4733 Obstructive sleep apnea (adult) (pediatric): Secondary | ICD-10-CM | POA: Diagnosis not present

## 2022-11-21 ENCOUNTER — Other Ambulatory Visit: Payer: Self-pay | Admitting: Surgery

## 2022-11-21 DIAGNOSIS — K8020 Calculus of gallbladder without cholecystitis without obstruction: Secondary | ICD-10-CM | POA: Diagnosis not present

## 2022-11-27 DIAGNOSIS — G4733 Obstructive sleep apnea (adult) (pediatric): Secondary | ICD-10-CM | POA: Diagnosis not present

## 2022-12-01 ENCOUNTER — Encounter (HOSPITAL_BASED_OUTPATIENT_CLINIC_OR_DEPARTMENT_OTHER): Payer: Self-pay | Admitting: Surgery

## 2022-12-01 ENCOUNTER — Other Ambulatory Visit: Payer: Self-pay

## 2022-12-06 ENCOUNTER — Encounter (HOSPITAL_BASED_OUTPATIENT_CLINIC_OR_DEPARTMENT_OTHER)
Admission: RE | Admit: 2022-12-06 | Discharge: 2022-12-06 | Disposition: A | Discharge status: Home / Self Care | Payer: BC Managed Care – PPO | Source: Ambulatory Visit | Attending: Surgery | Admitting: Surgery

## 2022-12-06 DIAGNOSIS — Z01818 Encounter for other preprocedural examination: Secondary | ICD-10-CM | POA: Insufficient documentation

## 2022-12-06 DIAGNOSIS — K8010 Calculus of gallbladder with chronic cholecystitis without obstruction: Secondary | ICD-10-CM | POA: Diagnosis not present

## 2022-12-06 DIAGNOSIS — I10 Essential (primary) hypertension: Secondary | ICD-10-CM | POA: Diagnosis not present

## 2022-12-06 DIAGNOSIS — Z6838 Body mass index (BMI) 38.0-38.9, adult: Secondary | ICD-10-CM | POA: Diagnosis not present

## 2022-12-06 DIAGNOSIS — E669 Obesity, unspecified: Secondary | ICD-10-CM | POA: Diagnosis not present

## 2022-12-06 DIAGNOSIS — G4730 Sleep apnea, unspecified: Secondary | ICD-10-CM | POA: Diagnosis not present

## 2022-12-06 DIAGNOSIS — K8020 Calculus of gallbladder without cholecystitis without obstruction: Secondary | ICD-10-CM | POA: Diagnosis not present

## 2022-12-06 DIAGNOSIS — Z87891 Personal history of nicotine dependence: Secondary | ICD-10-CM | POA: Diagnosis not present

## 2022-12-06 DIAGNOSIS — K219 Gastro-esophageal reflux disease without esophagitis: Secondary | ICD-10-CM | POA: Diagnosis not present

## 2022-12-06 LAB — BASIC METABOLIC PANEL
Anion gap: 8 (ref 5–15)
BUN: 19 mg/dL (ref 6–20)
CO2: 25 mmol/L (ref 22–32)
Calcium: 9.6 mg/dL (ref 8.9–10.3)
Chloride: 103 mmol/L (ref 98–111)
Creatinine, Ser: 0.96 mg/dL (ref 0.44–1.00)
GFR, Estimated: >60 mL/min (ref >60)
Glucose, Bld: 99 mg/dL (ref 70–99)
Potassium: 3.8 mmol/L (ref 3.5–5.1)
Sodium: 136 mmol/L (ref 135–145)

## 2022-12-06 LAB — POCT PREGNANCY, URINE: Preg Test, Ur: NEGATIVE

## 2022-12-06 MED ORDER — ENSURE PRE-SURGERY PO LIQD: 296.0000 mL | Freq: Once | ORAL | Status: DC

## 2022-12-06 NOTE — Progress Notes (Signed)

## 2022-12-07 NOTE — H&P (Signed)
REFERRING PHYSICIAN: Cleotis Nipper*  PROVIDER: Beverlee Nims, MD  MRN: [id number] DOB: 1971-02-27 DATE OF ENCOUNTER: 11/21/2022 Subjective  Chief Complaint: New Consultation (gallstones)   History of Present Illness: Kelsey Daniels is a 52 y.o. female who is seen today as an office consultation for evaluation of New Consultation (gallstones) .  This is a 52 year old female who is referred here for evaluation of symptomatic gallstones. Back in December she had an episode of nausea, vomiting, and abdominal pain. After that she presented to emergency department with upper respiratory complaints. They did not do any workup of her abdominal complaints. She then had a follow-up with her primary care provider who ordered an ultrasound showing gallstones with thickened gallbladder wall. Laboratory data was unremarkable. Since that time, she has been on a low-fat diet and is actually lost weight. She reports having a similar attack more than 10 years ago and was told at that time she had gallstones as well but she cannot recall whether or not she had an ultrasound. She has no cardiopulmonary complaints.  Review of Systems: A complete review of systems was obtained from the patient. I have reviewed this information and discussed as appropriate with the patient. See HPI as well for other ROS.  ROS  Medical History: Past Medical History: Diagnosis Date Anxiety GERD (gastroesophageal reflux disease) Hypertension Sleep apnea  There is no problem list on file for this patient.  Past Surgical History: Procedure Laterality Date deviated septum surgery   Allergies Allergen Reactions Neomycin Other (See Comments) and Rash Sulfa (Sulfonamide Antibiotics) Rash  Current Outpatient Medications on File Prior to Visit Medication Sig Dispense Refill atenoloL (TENORMIN) 50 MG tablet 1 tablet Orally Once a day for BP famotidine (PEPCID) 40 MG tablet 1 tablet with meals Orally  Twice a day FLUoxetine (PROZAC) 40 MG capsule 1 capsule Orally Once a day for anxiety medroxyPROGESTERone (DEPO-PROVERA) 150 mg/mL IM syringe as directed Intramuscular every 3 months metFORMIN (GLUCOPHAGE) 500 MG tablet 1/2 tablet with a meal Orally twice a day omeprazole (PRILOSEC) 40 MG DR capsule 1 capsule Orally Once a day 30-60 minutes before food for acid reflux triamterene-hydroCHLOROthiazide (MAXZIDE-25) 37.5-25 mg tablet 1 tablet Orally Once a day for BP XANAX 0.5 mg tablet 1/2 - 1 tablet Orally once a day as needed  No current facility-administered medications on file prior to visit.  Family History Problem Relation Age of Onset High blood pressure (Hypertension) Sister Coronary Artery Disease (Blocked arteries around heart) Sister Breast cancer Sister Obesity Brother High blood pressure (Hypertension) Brother   Social History  Tobacco Use Smoking Status Former Types: Cigarettes Smokeless Tobacco Never   Social History  Socioeconomic History Marital status: Married Tobacco Use Smoking status: Former Types: Cigarettes Smokeless tobacco: Never Vaping Use Vaping Use: Never used Substance and Sexual Activity Alcohol use: Not Currently Drug use: Never  Objective:  Vitals: 11/21/22 1511 BP: (!) 142/76 Pulse: 86 Temp: 37.1 C (98.7 F) SpO2: 96% Weight: (!) 102.1 kg (225 lb) Height: 162.6 cm (5\' 4" ) PainSc: 0-No pain  Body mass index is 38.62 kg/m.  Physical Exam  She appears well on exam.  Abdomen is soft. There is very slight tenderness in the right upper quadrant but no guarding.  There is no hepatomegaly  Labs, Imaging and Diagnostic Testing: I have reviewed her notes from her primary care provider and her ultrasound  Assessment and Plan:  Diagnoses and all orders for this visit:  Symptomatic cholelithiasis    We had a discussion regarding symptomatic gallstones  and the diagnosis of symptomatic cholelithiasis. We discussed treatment  for this which would be with a laparoscopic cholecystectomy. I gave her literature regarding this. We discussed the surgical procedure in detail. We discussed the risk which includes but is not limited to bleeding, infection, injury to surrounding structures, bile duct injury, bile leak, the need to convert to an open procedure, cardiopulmonary issues, DVT, postoperative recovery, etc. We also discussed continued conservative management without surgery. She wished to proceed with surgery which will be scheduled.

## 2022-12-08 ENCOUNTER — Other Ambulatory Visit: Payer: Self-pay

## 2022-12-08 ENCOUNTER — Ambulatory Visit (HOSPITAL_BASED_OUTPATIENT_CLINIC_OR_DEPARTMENT_OTHER): Payer: BC Managed Care – PPO | Admitting: Certified Registered"

## 2022-12-08 ENCOUNTER — Encounter (HOSPITAL_BASED_OUTPATIENT_CLINIC_OR_DEPARTMENT_OTHER)
Admission: RE | Disposition: A | Discharge status: Home / Self Care | Payer: Self-pay | Source: Ambulatory Visit | Attending: Surgery

## 2022-12-08 ENCOUNTER — Ambulatory Visit (HOSPITAL_BASED_OUTPATIENT_CLINIC_OR_DEPARTMENT_OTHER)
Admission: RE | Admit: 2022-12-08 | Discharge: 2022-12-08 | Disposition: A | Discharge status: Home / Self Care | Payer: BC Managed Care – PPO | Source: Ambulatory Visit | Attending: Surgery | Admitting: Surgery

## 2022-12-08 ENCOUNTER — Encounter (HOSPITAL_BASED_OUTPATIENT_CLINIC_OR_DEPARTMENT_OTHER): Payer: Self-pay | Admitting: Surgery

## 2022-12-08 DIAGNOSIS — I10 Essential (primary) hypertension: Secondary | ICD-10-CM | POA: Diagnosis not present

## 2022-12-08 DIAGNOSIS — G4730 Sleep apnea, unspecified: Secondary | ICD-10-CM | POA: Diagnosis not present

## 2022-12-08 DIAGNOSIS — Z6838 Body mass index (BMI) 38.0-38.9, adult: Secondary | ICD-10-CM | POA: Insufficient documentation

## 2022-12-08 DIAGNOSIS — K8020 Calculus of gallbladder without cholecystitis without obstruction: Secondary | ICD-10-CM | POA: Diagnosis not present

## 2022-12-08 DIAGNOSIS — K219 Gastro-esophageal reflux disease without esophagitis: Secondary | ICD-10-CM | POA: Insufficient documentation

## 2022-12-08 DIAGNOSIS — K8010 Calculus of gallbladder with chronic cholecystitis without obstruction: Secondary | ICD-10-CM | POA: Diagnosis not present

## 2022-12-08 DIAGNOSIS — D135 Benign neoplasm of extrahepatic bile ducts: Secondary | ICD-10-CM | POA: Diagnosis not present

## 2022-12-08 DIAGNOSIS — R7303 Prediabetes: Secondary | ICD-10-CM

## 2022-12-08 DIAGNOSIS — E669 Obesity, unspecified: Secondary | ICD-10-CM | POA: Diagnosis not present

## 2022-12-08 DIAGNOSIS — Z87891 Personal history of nicotine dependence: Secondary | ICD-10-CM | POA: Insufficient documentation

## 2022-12-08 DIAGNOSIS — Z01818 Encounter for other preprocedural examination: Secondary | ICD-10-CM

## 2022-12-08 HISTORY — DX: Calculus of gallbladder without cholecystitis without obstruction: K80.20

## 2022-12-08 HISTORY — DX: Gastro-esophageal reflux disease without esophagitis: K21.9

## 2022-12-08 HISTORY — DX: Sleep apnea, unspecified: G47.30

## 2022-12-08 HISTORY — DX: Anxiety disorder, unspecified: F41.9

## 2022-12-08 HISTORY — PX: CHOLECYSTECTOMY: SHX55

## 2022-12-08 HISTORY — DX: Essential (primary) hypertension: I10

## 2022-12-08 LAB — POCT PREGNANCY, URINE: Preg Test, Ur: NEGATIVE

## 2022-12-08 SURGERY — LAPAROSCOPIC CHOLECYSTECTOMY
Anesthesia: General | Site: Abdomen

## 2022-12-08 MED ORDER — KETOROLAC TROMETHAMINE 30 MG/ML IJ SOLN
INTRAMUSCULAR | Status: AC
  Filled 2022-12-08: qty 1

## 2022-12-08 MED ORDER — SUGAMMADEX SODIUM 200 MG/2ML IV SOLN
INTRAVENOUS | Status: DC | PRN
  Administered 2022-12-08: 200 mg via INTRAVENOUS

## 2022-12-08 MED ORDER — MIDAZOLAM HCL 2 MG/2ML IJ SOLN
INTRAMUSCULAR | Status: AC
  Filled 2022-12-08: qty 2

## 2022-12-08 MED ORDER — ACETAMINOPHEN 500 MG PO TABS
ORAL_TABLET | ORAL | Status: AC
  Filled 2022-12-08: qty 1

## 2022-12-08 MED ORDER — ONDANSETRON HCL 4 MG/2ML IJ SOLN
INTRAMUSCULAR | Status: DC | PRN
  Administered 2022-12-08: 4 mg via INTRAVENOUS

## 2022-12-08 MED ORDER — AMISULPRIDE (ANTIEMETIC) 5 MG/2ML IV SOLN
10.0000 mg | Freq: Once | INTRAVENOUS | Status: AC
  Administered 2022-12-08: 10 mg via INTRAVENOUS

## 2022-12-08 MED ORDER — ACETAMINOPHEN 500 MG PO TABS
1000.0000 mg | ORAL_TABLET | ORAL | Status: AC
  Administered 2022-12-08: 1000 mg via ORAL

## 2022-12-08 MED ORDER — DEXAMETHASONE SODIUM PHOSPHATE 10 MG/ML IJ SOLN
INTRAMUSCULAR | Status: AC
  Filled 2022-12-08: qty 1

## 2022-12-08 MED ORDER — ROCURONIUM BROMIDE 100 MG/10ML IV SOLN
INTRAVENOUS | Status: DC | PRN
  Administered 2022-12-08: 70 mg via INTRAVENOUS

## 2022-12-08 MED ORDER — FENTANYL CITRATE (PF) 100 MCG/2ML IJ SOLN
25.0000 ug | INTRAMUSCULAR | Status: DC | PRN
  Administered 2022-12-08: 50 ug via INTRAVENOUS

## 2022-12-08 MED ORDER — LIDOCAINE 2% (20 MG/ML) 5 ML SYRINGE
INTRAMUSCULAR | Status: AC
  Filled 2022-12-08: qty 5

## 2022-12-08 MED ORDER — KETOROLAC TROMETHAMINE 30 MG/ML IJ SOLN
INTRAMUSCULAR | Status: DC | PRN
  Administered 2022-12-08: 30 mg via INTRAVENOUS

## 2022-12-08 MED ORDER — FENTANYL CITRATE (PF) 100 MCG/2ML IJ SOLN
INTRAMUSCULAR | Status: DC | PRN
  Administered 2022-12-08: 50 ug via INTRAVENOUS

## 2022-12-08 MED ORDER — CHLORHEXIDINE GLUCONATE CLOTH 2 % EX PADS: 6.0000 | MEDICATED_PAD | Freq: Once | CUTANEOUS | Status: DC

## 2022-12-08 MED ORDER — MIDAZOLAM HCL 5 MG/5ML IJ SOLN
INTRAMUSCULAR | Status: DC | PRN
  Administered 2022-12-08: 2 mg via INTRAVENOUS

## 2022-12-08 MED ORDER — SCOPOLAMINE 1 MG/3DAYS TD PT72
1.0000 | MEDICATED_PATCH | TRANSDERMAL | Status: DC
  Administered 2022-12-08: 1.5 mg via TRANSDERMAL

## 2022-12-08 MED ORDER — SCOPOLAMINE 1 MG/3DAYS TD PT72
MEDICATED_PATCH | TRANSDERMAL | Status: AC
  Filled 2022-12-08: qty 1

## 2022-12-08 MED ORDER — PROPOFOL 10 MG/ML IV BOLUS
INTRAVENOUS | Status: DC | PRN
  Administered 2022-12-08: 200 mg via INTRAVENOUS

## 2022-12-08 MED ORDER — SODIUM CHLORIDE 0.9 % IR SOLN
Status: DC | PRN
  Administered 2022-12-08: 1000 mL

## 2022-12-08 MED ORDER — BUPIVACAINE HCL (PF) 0.25 % IJ SOLN
INTRAMUSCULAR | Status: DC | PRN
  Administered 2022-12-08: 20 mL

## 2022-12-08 MED ORDER — AMISULPRIDE (ANTIEMETIC) 5 MG/2ML IV SOLN
INTRAVENOUS | Status: AC
  Filled 2022-12-08: qty 4

## 2022-12-08 MED ORDER — ACETAMINOPHEN 500 MG PO TABS
ORAL_TABLET | ORAL | Status: AC
  Filled 2022-12-08: qty 2

## 2022-12-08 MED ORDER — DEXAMETHASONE SODIUM PHOSPHATE 4 MG/ML IJ SOLN
INTRAMUSCULAR | Status: DC | PRN
  Administered 2022-12-08: 8 mg via INTRAVENOUS

## 2022-12-08 MED ORDER — ROCURONIUM BROMIDE 10 MG/ML (PF) SYRINGE
PREFILLED_SYRINGE | INTRAVENOUS | Status: AC
  Filled 2022-12-08: qty 10

## 2022-12-08 MED ORDER — LACTATED RINGERS IV SOLN: INTRAVENOUS | Status: DC

## 2022-12-08 MED ORDER — LIDOCAINE HCL (CARDIAC) PF 100 MG/5ML IV SOSY
PREFILLED_SYRINGE | INTRAVENOUS | Status: DC | PRN
  Administered 2022-12-08: 50 mg via INTRAVENOUS

## 2022-12-08 MED ORDER — ONDANSETRON HCL 4 MG/2ML IJ SOLN
INTRAMUSCULAR | Status: AC
  Filled 2022-12-08: qty 2

## 2022-12-08 MED ORDER — PROMETHAZINE HCL 25 MG/ML IJ SOLN
6.2500 mg | Freq: Once | INTRAMUSCULAR | Status: AC
  Administered 2022-12-08: 6.25 mg via INTRAVENOUS

## 2022-12-08 MED ORDER — PROMETHAZINE HCL 25 MG/ML IJ SOLN
INTRAMUSCULAR | Status: AC
  Filled 2022-12-08: qty 1

## 2022-12-08 MED ORDER — FENTANYL CITRATE (PF) 100 MCG/2ML IJ SOLN
INTRAMUSCULAR | Status: AC
  Filled 2022-12-08: qty 2

## 2022-12-08 MED ORDER — OXYCODONE HCL 5 MG PO TABS: 5.0000 mg | ORAL_TABLET | Freq: Four times a day (QID) | ORAL | 0 refills | Status: DC | PRN

## 2022-12-08 MED ORDER — PROPOFOL 10 MG/ML IV BOLUS
INTRAVENOUS | Status: AC
  Filled 2022-12-08: qty 20

## 2022-12-08 MED ORDER — CEFAZOLIN SODIUM-DEXTROSE 2-4 GM/100ML-% IV SOLN
INTRAVENOUS | Status: AC
  Filled 2022-12-08: qty 100

## 2022-12-08 MED ORDER — CEFAZOLIN SODIUM-DEXTROSE 2-4 GM/100ML-% IV SOLN
2.0000 g | INTRAVENOUS | Status: AC
  Administered 2022-12-08: 2 g via INTRAVENOUS

## 2022-12-08 SURGICAL SUPPLY — 37 items
ADH SKN CLS APL DERMABOND .7 (GAUZE/BANDAGES/DRESSINGS) ×1
APL PRP STRL LF DISP 70% ISPRP (MISCELLANEOUS) ×1
APPLIER CLIP 5 13 M/L LIGAMAX5 (MISCELLANEOUS) ×1
APR CLP MED LRG 5 ANG JAW (MISCELLANEOUS) ×1
BLADE CLIPPER SURG (BLADE) IMPLANT
CHLORAPREP W/TINT 26 (MISCELLANEOUS) ×2 IMPLANT
CLIP APPLIE 5 13 M/L LIGAMAX5 (MISCELLANEOUS) ×2 IMPLANT
COVER MAYO STAND STRL (DRAPES) IMPLANT
DERMABOND ADVANCED .7 DNX12 (GAUZE/BANDAGES/DRESSINGS) ×2 IMPLANT
DRAPE C-ARM 42X72 X-RAY (DRAPES) IMPLANT
ELECT REM PT RETURN 9FT ADLT (ELECTROSURGICAL) ×1
ELECTRODE REM PT RTRN 9FT ADLT (ELECTROSURGICAL) ×2 IMPLANT
GAUZE 4X4 16PLY ~~LOC~~+RFID DBL (SPONGE) ×2 IMPLANT
GLOVE SURG SIGNA 7.5 PF LTX (GLOVE) ×2 IMPLANT
GOWN STRL REUS W/ TWL LRG LVL3 (GOWN DISPOSABLE) ×4 IMPLANT
GOWN STRL REUS W/ TWL XL LVL3 (GOWN DISPOSABLE) ×2 IMPLANT
GOWN STRL REUS W/TWL LRG LVL3 (GOWN DISPOSABLE) ×2
GOWN STRL REUS W/TWL XL LVL3 (GOWN DISPOSABLE) ×1
IRRIG SUCT STRYKERFLOW 2 WTIP (MISCELLANEOUS) ×1
IRRIGATION SUCT STRKRFLW 2 WTP (MISCELLANEOUS) ×2 IMPLANT
NS IRRIG 1000ML POUR BTL (IV SOLUTION) ×2 IMPLANT
PACK BASIN DAY SURGERY FS (CUSTOM PROCEDURE TRAY) ×2 IMPLANT
SCISSORS LAP 5X35 DISP (ENDOMECHANICALS) ×2 IMPLANT
SET CHOLANGIOGRAPH 5 50 .035 (SET/KITS/TRAYS/PACK) IMPLANT
SET TUBE SMOKE EVAC HIGH FLOW (TUBING) ×2 IMPLANT
SLEEVE ENDOPATH XCEL 5M (ENDOMECHANICALS) ×4 IMPLANT
SLEEVE SCD COMPRESS KNEE MED (STOCKING) ×2 IMPLANT
SPECIMEN JAR SMALL (MISCELLANEOUS) ×2 IMPLANT
SPIKE FLUID TRANSFER (MISCELLANEOUS) ×2 IMPLANT
SUT MON AB 4-0 PC3 18 (SUTURE) ×2 IMPLANT
SYS BAG RETRIEVAL 10MM (BASKET) ×1
SYSTEM BAG RETRIEVAL 10MM (BASKET) ×2 IMPLANT
TOWEL GREEN STERILE FF (TOWEL DISPOSABLE) ×2 IMPLANT
TRAY LAPAROSCOPIC (CUSTOM PROCEDURE TRAY) ×2 IMPLANT
TROCAR XCEL BLUNT TIP 100MML (ENDOMECHANICALS) ×2 IMPLANT
TROCAR XCEL NON-BLD 5MMX100MML (ENDOMECHANICALS) ×2 IMPLANT
TUBE CONNECTING 20X1/4 (TUBING) ×2 IMPLANT

## 2022-12-08 NOTE — Interval H&P Note (Signed)
History and Physical Interval Note:no change in H and P  12/08/2022 10:40 AM  Kelsey Daniels  has presented today for surgery, with the diagnosis of SYMPTOMATIC GALLSTONES.  The various methods of treatment have been discussed with the patient and family. After consideration of risks, benefits and other options for treatment, the patient has consented to  Procedure(s): LAPAROSCOPIC CHOLECYSTECTOMY (N/A) as a surgical intervention.  The patient's history has been reviewed, patient examined, no change in status, stable for surgery.  I have reviewed the patient's chart and labs.  Questions were answered to the patient's satisfaction.     Coralie Keens

## 2022-12-08 NOTE — Discharge Instructions (Addendum)
[facility] ______[facility] LAPAROSCOPIC SURGERY: POST OP INSTRUCTIONS Always review your discharge instruction sheet given to you by the facility where your surgery was performed. IF YOU HAVE DISABILITY OR FAMILY LEAVE FORMS, YOU MUST BRING THEM TO THE OFFICE FOR PROCESSING.   DO NOT GIVE THEM TO YOUR DOCTOR.  A prescription for pain medication may be given to you upon discharge.  Take your pain medication as prescribed, if needed.  If narcotic pain medicine is not needed, then you may take acetaminophen (Tylenol) or ibuprofen (Advil) as needed. Next dose of Tylenol due at 4:45 today if needed Take your usually prescribed medications unless otherwise directed. If you need a refill on your pain medication, please contact your pharmacy.  They will contact our office to request authorization. Prescriptions will not be filled after 5pm or on week-ends. You should follow a light diet the first few days after arrival home, such as soup and crackers, etc.  Be sure to include lots of fluids daily. Most patients will experience some swelling and bruising in the area of the incisions.  Ice packs will help.  Swelling and bruising can take several days to resolve.  It is common to experience some constipation if taking pain medication after surgery.  Increasing fluid intake and taking a stool softener (such as Colace) will usually help or prevent this problem from occurring.  A mild laxative (Milk of Magnesia or Miralax) should be taken according to package instructions if there are no bowel movements after 48 hours. Unless discharge instructions indicate otherwise, you may remove your bandages 24-48 hours after surgery, and you may shower at that time.  You may have steri-strips (small skin tapes) in place directly over the incision.  These strips should be left on the skin for 7-10 days.  If your surgeon used skin glue on the incision, you may shower in 24 hours.  The glue will flake off over the  next 2-3 weeks.  Any sutures or staples will be removed at the office during your follow-up visit. ACTIVITIES:  You may resume regular (light) daily activities beginning the next day--such as daily self-care, walking, climbing stairs--gradually increasing activities as tolerated.  You may have sexual intercourse when it is comfortable.  Refrain from any heavy lifting or straining until approved by your doctor. You may drive when you are no longer taking prescription pain medication, you can comfortably wear a seatbelt, and you can safely maneuver your car and apply brakes. RETURN TO WORK:  __________________________________________________________ Dennis Bast should see your doctor in the office for a follow-up appointment approximately 2-3 weeks after your surgery.  Make sure that you call for this appointment within a day or two after you arrive home to insure a convenient appointment time. OTHER INSTRUCTIONS: YOU MAY SHOWER STARTING TOMORROW ICE PACK, TYLENOL, AND IBUPROFEN ALSO FOR PAIN NO LIFTING MORE THAN 15 POUNDS FOR 2 WEEKS __________________________________________________________________________________________________________________________ __________________________________________________________________________________________________________________________ WHEN TO CALL YOUR DOCTOR: Fever over 101.0 Inability to urinate Continued bleeding from incision. Increased pain, redness, or drainage from the incision. Increasing abdominal pain  The clinic staff is available to answer your questions during regular business hours.  Please don't hesitate to call and ask to speak to one of the nurses for clinical concerns.  If you have a medical emergency, go to the nearest emergency room or call 911.  A surgeon from [facility] is always on call at the hospital. [address], [place], [place], [place]  [address] ? [id number],  [place], [place]   [address] [phone] ?  [phone] ? FAX [phone] Web site: [email or web address]   Post Anesthesia Home Care Instructions  Activity: Get plenty of rest for the remainder of the day. A responsible individual must stay with you for 24 hours following the procedure.  For the next 24 hours, DO NOT: -Drive a car -Paediatric nurse -Drink alcoholic beverages -Take any medication unless instructed by your physician -Make any legal decisions or sign important papers.  Meals: Start with liquid foods such as gelatin or soup. Progress to regular foods as tolerated. Avoid greasy, spicy, heavy foods. If nausea and/or vomiting occur, drink only clear liquids until the nausea and/or vomiting subsides. Call your physician if vomiting continues.  Special Instructions/Symptoms: Your throat may feel dry or sore from the anesthesia or the breathing tube placed in your throat during surgery. If this causes discomfort, gargle with warm salt water. The discomfort should disappear within 24 hours.  If you had a scopolamine patch placed behind your ear for the management of post- operative nausea and/or vomiting:  1. The medication in the patch is effective for 72 hours, after which it should be removed.  Wrap patch in a tissue and discard in the trash. Wash hands thoroughly with soap and water. 2. You may remove the patch earlier than 72 hours if you experience unpleasant side effects which may include dry mouth, dizziness or visual disturbances. 3. Avoid touching the patch. Wash your hands with soap and water after contact with the patch.

## 2022-12-08 NOTE — Anesthesia Preprocedure Evaluation (Signed)
Anesthesia Evaluation  Patient identified by MRN, date of birth, ID band Patient awake    Reviewed: Allergy & Precautions, NPO status , Patient's Chart, lab work & pertinent test results, reviewed documented beta blocker date and time   Airway Mallampati: II       Dental no notable dental hx. (+) Teeth Intact   Pulmonary sleep apnea and Continuous Positive Airway Pressure Ventilation    Pulmonary exam normal breath sounds clear to auscultation       Cardiovascular hypertension, Pt. on medications and Pt. on home beta blockers Normal cardiovascular exam Rhythm:Regular Rate:Normal     Neuro/Psych   Anxiety     negative neurological ROS     GI/Hepatic Neg liver ROS,GERD  Medicated and Controlled,,Cholelithiasis   Endo/Other  Obesity  Renal/GU negative Renal ROS  negative genitourinary   Musculoskeletal negative musculoskeletal ROS (+)    Abdominal  (+) + obese  Peds  Hematology negative hematology ROS (+)   Anesthesia Other Findings   Reproductive/Obstetrics negative OB ROS                              Anesthesia Physical Anesthesia Plan  ASA: 2  Anesthesia Plan: General   Post-op Pain Management: Dilaudid IV, Precedex, Ketamine IV* and Ofirmev IV (intra-op)*   Induction: Intravenous, Rapid sequence and Cricoid pressure planned  PONV Risk Score and Plan: 4 or greater and Treatment may vary due to age or medical condition, Midazolam, Scopolamine patch - Pre-op, Ondansetron and Dexamethasone  Airway Management Planned: Oral ETT  Additional Equipment: None  Intra-op Plan:   Post-operative Plan: Extubation in OR  Informed Consent: I have reviewed the patients History and Physical, chart, labs and discussed the procedure including the risks, benefits and alternatives for the proposed anesthesia with the patient or authorized representative who has indicated his/her understanding and  acceptance.     Dental advisory given  Plan Discussed with: CRNA and Anesthesiologist  Anesthesia Plan Comments:          Anesthesia Quick Evaluation

## 2022-12-08 NOTE — Op Note (Signed)
Laparoscopic Cholecystectomy Procedure Note  Indications: This patient presents with symptomatic gallbladder disease and will undergo laparoscopic cholecystectomy.  Pre-operative Diagnosis: symptomatic cholelithiasis  Post-operative Diagnosis: same  Surgeon: Coralie Keens   Assistants: none  Anesthesia: General endotracheal anesthesia  ASA Class: 2  Procedure Details  The patient was seen again in the Holding Room. The risks, benefits, complications, treatment options, and expected outcomes were discussed with the patient. The possibilities of reaction to medication, pulmonary aspiration, perforation of viscus, bleeding, recurrent infection, finding a normal gallbladder, the need for additional procedures, failure to diagnose a condition, the possible need to convert to an open procedure, and creating a complication requiring transfusion or operation were discussed with the patient. The likelihood of improving the patient's symptoms with return to their baseline status is good.  The patient and/or family concurred with the proposed plan, giving informed consent. The site of surgery properly noted. The patient was taken to Operating Room, identified as Kelsey Daniels and the procedure verified as Laparoscopic Cholecystectomy with Intraoperative Cholangiogram. A Time Out was held and the above information confirmed.  Prior to the induction of general anesthesia, antibiotic prophylaxis was administered. General endotracheal anesthesia was then administered and tolerated well. After the induction, the abdomen was prepped with Chloraprep and draped in sterile fashion. The patient was positioned in the supine position.  Local anesthetic agent was injected into the skin near the umbilicus and an incision made. We dissected down to the abdominal fascia with blunt dissection.  The fascia was incised vertically and we entered the peritoneal cavity bluntly.  A pursestring suture of 0-Vicryl was  placed around the fascial opening.  The Hasson cannula was inserted and secured with the stay suture.  Pneumoperitoneum was then created with CO2 and tolerated well without any adverse changes in the patient's vital signs. A  5-mm port was placed in the subxiphoid position.  Two 5-mm ports were placed in the right upper quadrant. All skin incisions were infiltrated with a local anesthetic agent before making the incision and placing the trocars.   We positioned the patient in reverse Trendelenburg, tilted slightly to the patient's left.  The gallbladder was identified, the fundus grasped and retracted cephalad. Adhesions were lysed bluntly and with the electrocautery where indicated, taking care not to injure any adjacent organs or viscus. The infundibulum was grasped and retracted laterally, exposing the peritoneum overlying the triangle of Calot. This was then divided and exposed in a blunt fashion. The cystic duct was clearly identified and bluntly dissected circumferentially. A critical view of the cystic duct and cystic artery was obtained.  The cystic duct was then ligated with clips and divided. The cystic artery was, dissected free, ligated with clips and divided as well.  The posterior branch of the cystic artery was also clipped and divided.  The gallbladder was dissected from the liver bed in retrograde fashion with the electrocautery. The gallbladder was removed and placed in an Endocatch sac. The liver bed was irrigated and inspected. Hemostasis was achieved with the electrocautery. Copious irrigation was utilized and was repeatedly aspirated until clear.  The gallbladder and Endocatch sac were then removed through the umbilical port site.  The pursestring suture was used to close the umbilical fascia.    We again inspected the right upper quadrant for hemostasis.  Pneumoperitoneum was released as we removed the trocars under direct vision.  4-0 Monocryl was used to close the skin.   Dermabond was  then applied. The patient was then extubated  and brought to the recovery room in stable condition. Instrument, sponge, and needle counts were correct at closure and at the conclusion of the case.   Findings: Chronic cholecystitis with Cholelithiasis  Estimated Blood Loss: Minimal                Specimens: Gallbladder           Complications: None; patient tolerated the procedure well.         Disposition: PACU - hemodynamically stable.         Condition: stable

## 2022-12-08 NOTE — Anesthesia Procedure Notes (Addendum)
Procedure Name: Intubation Date/Time: 12/08/2022 11:28 AM  Performed by: Bufford Spikes, CRNAPre-anesthesia Checklist: Patient identified, Emergency Drugs available, Suction available and Patient being monitored Patient Re-evaluated:Patient Re-evaluated prior to induction Oxygen Delivery Method: Circle system utilized Preoxygenation: Pre-oxygenation with 100% oxygen Induction Type: IV induction Ventilation: Mask ventilation without difficulty Laryngoscope Size: Glidescope and 3 Grade View: Grade III Tube type: Oral Tube size: 7.0 mm Number of attempts: 1 Airway Equipment and Method: Stylet, Oral airway and Video-laryngoscopy Placement Confirmation: ETT inserted through vocal cords under direct vision, positive ETCO2 and breath sounds checked- equal and bilateral Secured at: 21 cm Tube secured with: Tape Dental Injury: Teeth and Oropharynx as per pre-operative assessment  Difficulty Due To: Difficulty was anticipated, Difficult Airway- due to large tongue, Difficult Airway- due to anterior larynx, Difficult Airway- due to limited oral opening and Difficult Airway- due to reduced neck mobility Future Recommendations: Recommend- induction with short-acting agent, and alternative techniques readily available Comments: DL x 1 with Miller 2. Grade 3 view with esophageal intubation. Immediately recognized. O2 sat 99% throughout. Good mask airway with oral airway in place. Switched to Glidescope x 1 attempt. Good view and positive intubation. +ETCO2. VSS.

## 2022-12-08 NOTE — Transfer of Care (Signed)
Immediate Anesthesia Transfer of Care Note  Patient: Kelsey Daniels  Procedure(s) Performed: LAPAROSCOPIC CHOLECYSTECTOMY (Abdomen)  Patient Location: PACU  Anesthesia Type:General  Level of Consciousness: awake, alert , and oriented  Airway & Oxygen Therapy: Patient Spontanous Breathing and Patient connected to face mask oxygen  Post-op Assessment: Report given to RN and Post -op Vital signs reviewed and stable  Post vital signs: Reviewed and stable  Last Vitals:  Vitals Value Taken Time  BP    Temp    Pulse 72 12/08/22 1216  Resp 14 12/08/22 1216  SpO2 100 % 12/08/22 1216  Vitals shown include unvalidated device data.  Last Pain:  Vitals:   12/08/22 1052  TempSrc: Oral  PainSc: 0-No pain         Complications:  Encounter Notable Events  Notable Event Outcome Phase Comment  Difficult to intubate - expected  Intraprocedure Filed from anesthesia note documentation.

## 2022-12-08 NOTE — Anesthesia Postprocedure Evaluation (Signed)
Anesthesia Post Note  Patient: AMIL MOSEMAN  Procedure(s) Performed: LAPAROSCOPIC CHOLECYSTECTOMY (Abdomen)     Patient location during evaluation: PACU Anesthesia Type: General Level of consciousness: awake and alert Pain management: pain level controlled Vital Signs Assessment: post-procedure vital signs reviewed and stable Respiratory status: spontaneous breathing, nonlabored ventilation, respiratory function stable and patient connected to nasal cannula oxygen Cardiovascular status: blood pressure returned to baseline and stable Postop Assessment: no apparent nausea or vomiting Anesthetic complications: yes  Encounter Notable Events  Notable Event Outcome Phase Comment  Difficult to intubate - expected  Intraprocedure Filed from anesthesia note documentation.    Last Vitals:  Vitals:   12/08/22 1330 12/08/22 1339  BP:  (!) 148/70  Pulse: 70 71  Resp: 14 14  Temp:  (!) 36.3 C  SpO2: 95% 96%    Last Pain:  Vitals:   12/08/22 1339  TempSrc:   PainSc: 3                  Anni Hocevar L Era Parr

## 2022-12-09 ENCOUNTER — Encounter (HOSPITAL_BASED_OUTPATIENT_CLINIC_OR_DEPARTMENT_OTHER): Payer: Self-pay | Admitting: Surgery

## 2022-12-12 LAB — SURGICAL PATHOLOGY

## 2022-12-16 DIAGNOSIS — Z3042 Encounter for surveillance of injectable contraceptive: Secondary | ICD-10-CM | POA: Diagnosis not present

## 2023-01-16 DIAGNOSIS — G4733 Obstructive sleep apnea (adult) (pediatric): Secondary | ICD-10-CM | POA: Diagnosis not present

## 2023-01-19 DIAGNOSIS — Z0000 Encounter for general adult medical examination without abnormal findings: Secondary | ICD-10-CM | POA: Diagnosis not present

## 2023-01-19 DIAGNOSIS — E7800 Pure hypercholesterolemia, unspecified: Secondary | ICD-10-CM | POA: Diagnosis not present

## 2023-01-19 DIAGNOSIS — E1169 Type 2 diabetes mellitus with other specified complication: Secondary | ICD-10-CM | POA: Diagnosis not present

## 2023-01-19 DIAGNOSIS — I10 Essential (primary) hypertension: Secondary | ICD-10-CM | POA: Diagnosis not present

## 2023-02-16 DIAGNOSIS — G4733 Obstructive sleep apnea (adult) (pediatric): Secondary | ICD-10-CM | POA: Diagnosis not present

## 2023-03-10 DIAGNOSIS — Z3042 Encounter for surveillance of injectable contraceptive: Secondary | ICD-10-CM | POA: Diagnosis not present

## 2023-03-18 DIAGNOSIS — G4733 Obstructive sleep apnea (adult) (pediatric): Secondary | ICD-10-CM | POA: Diagnosis not present

## 2023-04-26 DIAGNOSIS — K5730 Diverticulosis of large intestine without perforation or abscess without bleeding: Secondary | ICD-10-CM | POA: Diagnosis not present

## 2023-04-26 DIAGNOSIS — K635 Polyp of colon: Secondary | ICD-10-CM | POA: Diagnosis not present

## 2023-04-26 DIAGNOSIS — Z1211 Encounter for screening for malignant neoplasm of colon: Secondary | ICD-10-CM | POA: Diagnosis not present

## 2023-04-26 DIAGNOSIS — D128 Benign neoplasm of rectum: Secondary | ICD-10-CM | POA: Diagnosis not present

## 2023-06-01 DIAGNOSIS — Z3042 Encounter for surveillance of injectable contraceptive: Secondary | ICD-10-CM | POA: Diagnosis not present

## 2023-07-25 DIAGNOSIS — K219 Gastro-esophageal reflux disease without esophagitis: Secondary | ICD-10-CM | POA: Diagnosis not present

## 2023-07-25 DIAGNOSIS — E7800 Pure hypercholesterolemia, unspecified: Secondary | ICD-10-CM | POA: Diagnosis not present

## 2023-07-25 DIAGNOSIS — F411 Generalized anxiety disorder: Secondary | ICD-10-CM | POA: Diagnosis not present

## 2023-07-25 DIAGNOSIS — Z23 Encounter for immunization: Secondary | ICD-10-CM | POA: Diagnosis not present

## 2023-07-25 DIAGNOSIS — I10 Essential (primary) hypertension: Secondary | ICD-10-CM | POA: Diagnosis not present

## 2023-07-25 DIAGNOSIS — R7303 Prediabetes: Secondary | ICD-10-CM | POA: Diagnosis not present

## 2023-08-18 DIAGNOSIS — K629 Disease of anus and rectum, unspecified: Secondary | ICD-10-CM | POA: Diagnosis not present

## 2023-08-24 DIAGNOSIS — Z3042 Encounter for surveillance of injectable contraceptive: Secondary | ICD-10-CM | POA: Diagnosis not present

## 2023-09-07 DIAGNOSIS — K603 Anal fistula, unspecified: Secondary | ICD-10-CM | POA: Diagnosis not present

## 2023-09-13 NOTE — Progress Notes (Signed)
Sent message, via epic in basket, requesting orders in epic from surgeon.  

## 2023-09-14 DIAGNOSIS — G4733 Obstructive sleep apnea (adult) (pediatric): Secondary | ICD-10-CM | POA: Diagnosis not present

## 2023-09-15 ENCOUNTER — Ambulatory Visit: Payer: Self-pay | Admitting: Surgery

## 2023-09-15 NOTE — Progress Notes (Addendum)
PCP - Debroah Loop, DO [facility] Cardiologist - no  PPM/ICD -  Device Orders -  Rep Notified -   Chest x-ray -  EKG - 12-06-22 epic Stress Test -  ECHO -  Cardiac Cath -   Sleep Study -  CPAP - yes  Fasting Blood Sugar -  Checks Blood Sugar _____ times a day  Blood Thinner Instructions: Aspirin Instructions:  ERAS Protcol - PRE-SURGERY    COVID vaccine -yes  Activity--Able to climb a flight of stairs without CP or SOB Anesthesia review: HTN, OSA  Patient denies shortness of breath, fever, cough and chest pain at PAT appointment   All instructions explained to the patient, with a verbal understanding of the material. Patient agrees to go over the instructions while at home for a better understanding. Patient also instructed to self quarantine after being tested for COVID-19. The opportunity to ask questions was provided.

## 2023-09-15 NOTE — Patient Instructions (Signed)
SURGICAL WAITING ROOM VISITATION  Patients having surgery or a procedure may have no more than 2 support people in the waiting area - these visitors may rotate.    Children under the age of 98 must have an adult with them who is not the patient.   If the patient needs to stay at the hospital during part of their recovery, the visitor guidelines for inpatient rooms apply. Pre-op nurse will coordinate an appropriate time for 1 support person to accompany patient in pre-op.  This support person may not rotate.    Please refer to the [facility] website for the visitor guidelines for Inpatients (after your surgery is over and you are in a regular room).       Your procedure is scheduled on: 10-02-23   Report to [facility] Main Entrance    Report to admitting at       12:45  PM   Call this number if you have problems the morning of surgery [phone]   Do not eat food :After Midnight.   After Midnight you may have the following liquids until __0900____ AM/ DAY OF SURGERY   then nothing by mouth  Water Non-Citrus Juices (without pulp, NO RED-Apple, White grape, White cranberry) Black Coffee (NO MILK/CREAM OR CREAMERS, sugar ok)  Clear Tea (NO MILK/CREAM OR CREAMERS, sugar ok) regular and decaf                             Plain Jell-O (NO RED)                                           Fruit ices (not with fruit pulp, NO RED)                                     Popsicles (NO RED)                                                               Sports drinks like Gatorade (NO RED)                  ONE FLEETS ENEMA AM OF SURGERY              If you have questions, please contact your surgeon's office.   FOLLOW BOWEL PREP AND ANY ADDITIONAL PRE OP INSTRUCTIONS YOU RECEIVED FROM YOUR SURGEON'S OFFICE!!!     Oral Hygiene is also important to reduce your risk of infection.                                    Remember - BRUSH YOUR TEETH THE MORNING OF SURGERY WITH YOUR REGULAR  TOOTHPASTE  DENTURES WILL BE REMOVED PRIOR TO SURGERY PLEASE DO NOT APPLY "Poly grip" OR ADHESIVES!!!   Do NOT smoke after Midnight   Stop all vitamins and herbal supplements 7 days before surgery.   Take these medicines the morning of surgery with A SIP OF WATER: fluoxetine, OMEPRAZOLE,  FAMOTIDINE    Bring CPAP mask and tubing day of surgery.                              You may not have any metal on your body including hair pins, jewelry, and body piercing             Do not wear make-up, lotions, powders, perfumes/cologne, or deodorant  Do not wear nail polish including gel and S&S, artificial/acrylic nails, or any other type of covering on natural nails including finger and toenails. If you have artificial nails, gel coating, etc. that needs to be removed by a nail salon please have this removed prior to surgery or surgery may need to be canceled/ delayed if the surgeon/ anesthesia feels like they are unable to be safely monitored.   Do not shave  48 hours prior to surgery.          Do not bring valuables to the hospital. [facility] IS NOT             RESPONSIBLE   FOR VALUABLES.   Contacts, glasses, dentures or bridgework may not be worn into surgery.   Bring small overnight bag day of surgery.   DO NOT BRING YOUR HOME MEDICATIONS TO THE HOSPITAL. PHARMACY WILL DISPENSE MEDICATIONS LISTED ON YOUR MEDICATION LIST TO YOU DURING YOUR ADMISSION IN THE HOSPITAL!    Patients discharged on the day of surgery will not be allowed to drive home.  Someone NEEDS to stay with you for the first 24 hours after anesthesia.   Special Instructions: Bring a copy of your healthcare power of attorney and living will documents the day of surgery if you haven't scanned them before.              Please read over the following fact sheets you were given: IF YOU HAVE QUESTIONS ABOUT YOUR PRE-OP INSTRUCTIONS PLEASE CALL [phone]    If you test positive for Covid or have been in contact with  anyone that has tested positive in the last 10 days please notify you surgeon.     - Preparing for Surgery Before surgery, you can play an important role.  Because skin is not sterile, your skin needs to be as free of germs as possible.  You can reduce the number of germs on your skin by washing with CHG (chlorahexidine gluconate) soap before surgery.  CHG is an antiseptic cleaner which kills germs and bonds with the skin to continue killing germs even after washing. Please DO NOT use if you have an allergy to CHG or antibacterial soaps.  If your skin becomes reddened/irritated stop using the CHG and inform your nurse when you arrive at Short Stay. Do not shave (including legs and underarms) for at least 48 hours prior to the first CHG shower.  You may shave your face/neck. Please follow these instructions carefully:  1.  Shower with CHG Soap the night before surgery and the  morning of Surgery.  2.  If you choose to wash your hair, wash your hair first as usual with your  normal  shampoo.  3.  After you shampoo, rinse your hair and body thoroughly to remove the  shampoo.                           4.  Use CHG as you would any other liquid soap.  You can apply chg directly  to the skin and wash                       Gently with a scrungie or clean washcloth.  5.  Apply the CHG Soap to your body ONLY FROM THE NECK DOWN.   Do not use on face/ open                           Wound or open sores. Avoid contact with eyes, ears mouth and genitals (private parts).                       Wash face,  Genitals (private parts) with your normal soap.             6.  Wash thoroughly, paying special attention to the area where your surgery  will be performed.  7.  Thoroughly rinse your body with warm water from the neck down.  8.  DO NOT shower/wash with your normal soap after using and rinsing off  the CHG Soap.                9.  Pat yourself dry with a clean towel.            10.  Wear clean  pajamas.            11.  Place clean sheets on your bed the night of your first shower and do not  sleep with pets. Day of Surgery : Do not apply any lotions/deodorants the morning of surgery.  Please wear clean clothes to the hospital/surgery center.  FAILURE TO FOLLOW THESE INSTRUCTIONS MAY RESULT IN THE CANCELLATION OF YOUR SURGERY PATIENT SIGNATURE_________________________________  NURSE SIGNATURE__________________________________  ________________________________________________________________________

## 2023-09-20 ENCOUNTER — Other Ambulatory Visit: Payer: Self-pay

## 2023-09-20 ENCOUNTER — Encounter (HOSPITAL_COMMUNITY): Payer: Self-pay

## 2023-09-20 ENCOUNTER — Encounter (HOSPITAL_COMMUNITY)
Admission: RE | Admit: 2023-09-20 | Discharge: 2023-09-20 | Disposition: A | Discharge status: Home / Self Care | Payer: BC Managed Care – PPO | Source: Ambulatory Visit | Attending: Surgery | Admitting: Surgery

## 2023-09-20 VITALS — BP 121/74 | HR 70 | Temp 98.5°F | Resp 16 | Ht 64.0 in | Wt 185.0 lb

## 2023-09-20 DIAGNOSIS — I10 Essential (primary) hypertension: Secondary | ICD-10-CM | POA: Insufficient documentation

## 2023-09-20 DIAGNOSIS — Z01812 Encounter for preprocedural laboratory examination: Secondary | ICD-10-CM | POA: Insufficient documentation

## 2023-09-20 HISTORY — DX: Family history of other specified conditions: Z84.89

## 2023-09-20 LAB — BASIC METABOLIC PANEL
Anion gap: 8 (ref 5–15)
BUN: 22 mg/dL — ABNORMAL HIGH (ref 6–20)
CO2: 24 mmol/L (ref 22–32)
Calcium: 9.8 mg/dL (ref 8.9–10.3)
Chloride: 104 mmol/L (ref 98–111)
Creatinine, Ser: 0.81 mg/dL (ref 0.44–1.00)
GFR, Estimated: >60 mL/min (ref >60)
Glucose, Bld: 100 mg/dL — ABNORMAL HIGH (ref 70–99)
Potassium: 4.1 mmol/L (ref 3.5–5.1)
Sodium: 136 mmol/L (ref 135–145)

## 2023-09-20 LAB — CBC
HCT: 39.5 % (ref 36.0–46.0)
Hemoglobin: 13.2 g/dL (ref 12.0–15.0)
MCH: 31.7 pg (ref 26.0–34.0)
MCHC: 33.4 g/dL (ref 30.0–36.0)
MCV: 95 fL (ref 80.0–100.0)
Platelets: 236 10*3/uL (ref 150–400)
RBC: 4.16 MIL/uL (ref 3.87–5.11)
RDW: 13.2 % (ref 11.5–15.5)
WBC: 7 10*3/uL (ref 4.0–10.5)
nRBC: 0 % (ref 0.0–0.2)

## 2023-10-02 ENCOUNTER — Encounter (HOSPITAL_COMMUNITY)
Admission: RE | Disposition: A | Discharge status: Home / Self Care | Payer: Self-pay | Source: Home / Self Care | Attending: Surgery

## 2023-10-02 ENCOUNTER — Ambulatory Visit (HOSPITAL_COMMUNITY)
Admission: RE | Admit: 2023-10-02 | Discharge: 2023-10-02 | Disposition: A | Discharge status: Home / Self Care | Payer: BC Managed Care – PPO | Attending: Surgery | Admitting: Surgery

## 2023-10-02 ENCOUNTER — Ambulatory Visit (HOSPITAL_COMMUNITY): Payer: BC Managed Care – PPO | Admitting: Anesthesiology

## 2023-10-02 ENCOUNTER — Encounter (HOSPITAL_COMMUNITY): Payer: Self-pay | Admitting: Surgery

## 2023-10-02 DIAGNOSIS — K60319 Anal fistula, simple, unspecified: Secondary | ICD-10-CM | POA: Diagnosis not present

## 2023-10-02 DIAGNOSIS — K5730 Diverticulosis of large intestine without perforation or abscess without bleeding: Secondary | ICD-10-CM | POA: Insufficient documentation

## 2023-10-02 DIAGNOSIS — F419 Anxiety disorder, unspecified: Secondary | ICD-10-CM | POA: Insufficient documentation

## 2023-10-02 DIAGNOSIS — K219 Gastro-esophageal reflux disease without esophagitis: Secondary | ICD-10-CM | POA: Diagnosis not present

## 2023-10-02 DIAGNOSIS — I10 Essential (primary) hypertension: Secondary | ICD-10-CM | POA: Insufficient documentation

## 2023-10-02 DIAGNOSIS — K603 Anal fistula, unspecified: Secondary | ICD-10-CM | POA: Diagnosis not present

## 2023-10-02 DIAGNOSIS — E119 Type 2 diabetes mellitus without complications: Secondary | ICD-10-CM | POA: Insufficient documentation

## 2023-10-02 DIAGNOSIS — K60311 Anal fistula, simple, initial: Secondary | ICD-10-CM | POA: Diagnosis not present

## 2023-10-02 DIAGNOSIS — K648 Other hemorrhoids: Secondary | ICD-10-CM | POA: Diagnosis not present

## 2023-10-02 DIAGNOSIS — G4730 Sleep apnea, unspecified: Secondary | ICD-10-CM | POA: Diagnosis not present

## 2023-10-02 DIAGNOSIS — Z87891 Personal history of nicotine dependence: Secondary | ICD-10-CM | POA: Diagnosis not present

## 2023-10-02 HISTORY — PX: FISTULOTOMY: SHX6413

## 2023-10-02 HISTORY — PX: RECTAL EXAM UNDER ANESTHESIA: SHX6399

## 2023-10-02 LAB — POCT PREGNANCY, URINE: Preg Test, Ur: NEGATIVE

## 2023-10-02 SURGERY — FISTULOTOMY
Anesthesia: General | Site: Rectum

## 2023-10-02 MED ORDER — ONDANSETRON HCL 4 MG/2ML IJ SOLN: 4.0000 mg | Freq: Four times a day (QID) | INTRAMUSCULAR | Status: DC | PRN

## 2023-10-02 MED ORDER — OXYCODONE HCL 5 MG PO TABS: 5.0000 mg | ORAL_TABLET | Freq: Three times a day (TID) | ORAL | 0 refills | Status: AC | PRN

## 2023-10-02 MED ORDER — SUGAMMADEX SODIUM 200 MG/2ML IV SOLN
INTRAVENOUS | Status: DC | PRN
  Administered 2023-10-02: 200 mg via INTRAVENOUS

## 2023-10-02 MED ORDER — ONDANSETRON HCL 4 MG/2ML IJ SOLN
INTRAMUSCULAR | Status: AC
  Filled 2023-10-02: qty 2

## 2023-10-02 MED ORDER — BUPIVACAINE-EPINEPHRINE 0.25% -1:200000 IJ SOLN
INTRAMUSCULAR | Status: DC | PRN
  Administered 2023-10-02: 30 mL

## 2023-10-02 MED ORDER — FENTANYL CITRATE (PF) 100 MCG/2ML IJ SOLN
INTRAMUSCULAR | Status: AC
  Filled 2023-10-02: qty 2

## 2023-10-02 MED ORDER — ONDANSETRON HCL 4 MG/2ML IJ SOLN
INTRAMUSCULAR | Status: DC | PRN
  Administered 2023-10-02: 4 mg via INTRAVENOUS

## 2023-10-02 MED ORDER — BUPIVACAINE-EPINEPHRINE 0.25% -1:200000 IJ SOLN
INTRAMUSCULAR | Status: AC
Start: 2023-10-02 — End: ?
  Filled 2023-10-02: qty 1

## 2023-10-02 MED ORDER — DEXAMETHASONE SODIUM PHOSPHATE 10 MG/ML IJ SOLN
INTRAMUSCULAR | Status: AC
Start: 2023-10-02 — End: ?
  Filled 2023-10-02: qty 1

## 2023-10-02 MED ORDER — PROPOFOL 10 MG/ML IV BOLUS
INTRAVENOUS | Status: DC | PRN
  Administered 2023-10-02: 200 mg via INTRAVENOUS

## 2023-10-02 MED ORDER — OXYCODONE HCL 5 MG PO TABS: 5.0000 mg | ORAL_TABLET | Freq: Once | ORAL | Status: DC | PRN

## 2023-10-02 MED ORDER — FENTANYL CITRATE PF 50 MCG/ML IJ SOSY: 25.0000 ug | PREFILLED_SYRINGE | INTRAMUSCULAR | Status: DC | PRN

## 2023-10-02 MED ORDER — MIDAZOLAM HCL 2 MG/2ML IJ SOLN
INTRAMUSCULAR | Status: AC
  Filled 2023-10-02: qty 2

## 2023-10-02 MED ORDER — DIBUCAINE (PERIANAL) 1 % EX OINT
TOPICAL_OINTMENT | CUTANEOUS | Status: DC | PRN
  Administered 2023-10-02: 1 via RECTAL

## 2023-10-02 MED ORDER — ROCURONIUM BROMIDE 10 MG/ML (PF) SYRINGE
PREFILLED_SYRINGE | INTRAVENOUS | Status: AC
  Filled 2023-10-02: qty 10

## 2023-10-02 MED ORDER — CHLORHEXIDINE GLUCONATE 0.12 % MT SOLN
15.0000 mL | Freq: Once | OROMUCOSAL | Status: AC
  Administered 2023-10-02: 15 mL via OROMUCOSAL

## 2023-10-02 MED ORDER — DIBUCAINE (PERIANAL) 1 % EX OINT
TOPICAL_OINTMENT | CUTANEOUS | Status: AC
  Filled 2023-10-02: qty 28

## 2023-10-02 MED ORDER — MIDAZOLAM HCL 5 MG/5ML IJ SOLN
INTRAMUSCULAR | Status: DC | PRN
  Administered 2023-10-02 (×2): 1 mg via INTRAVENOUS

## 2023-10-02 MED ORDER — PROPOFOL 10 MG/ML IV BOLUS
INTRAVENOUS | Status: AC
  Filled 2023-10-02: qty 20

## 2023-10-02 MED ORDER — OXYCODONE HCL 5 MG/5ML PO SOLN: 5.0000 mg | Freq: Once | ORAL | Status: DC | PRN

## 2023-10-02 MED ORDER — FLEET ENEMA RE ENEM: 1.0000 | ENEMA | Freq: Once | RECTAL | Status: DC

## 2023-10-02 MED ORDER — BUPIVACAINE LIPOSOME 1.3 % IJ SUSP
INTRAMUSCULAR | Status: DC | PRN
  Administered 2023-10-02: 20 mL

## 2023-10-02 MED ORDER — ACETAMINOPHEN 500 MG PO TABS
1000.0000 mg | ORAL_TABLET | ORAL | Status: AC
  Administered 2023-10-02: 1000 mg via ORAL
  Filled 2023-10-02: qty 2

## 2023-10-02 MED ORDER — FENTANYL CITRATE (PF) 100 MCG/2ML IJ SOLN
INTRAMUSCULAR | Status: DC | PRN
  Administered 2023-10-02 (×2): 50 ug via INTRAVENOUS

## 2023-10-02 MED ORDER — LIDOCAINE HCL (PF) 2 % IJ SOLN
INTRAMUSCULAR | Status: AC
  Filled 2023-10-02: qty 5

## 2023-10-02 MED ORDER — DEXAMETHASONE SODIUM PHOSPHATE 10 MG/ML IJ SOLN
INTRAMUSCULAR | Status: DC | PRN
  Administered 2023-10-02: 5 mg via INTRAVENOUS

## 2023-10-02 MED ORDER — LACTATED RINGERS IV SOLN: INTRAVENOUS | Status: DC | PRN

## 2023-10-02 MED ORDER — LIDOCAINE HCL (CARDIAC) PF 100 MG/5ML IV SOSY
PREFILLED_SYRINGE | INTRAVENOUS | Status: DC | PRN
  Administered 2023-10-02: 50 mg via INTRAVENOUS

## 2023-10-02 MED ORDER — ROCURONIUM BROMIDE 100 MG/10ML IV SOLN
INTRAVENOUS | Status: DC | PRN
  Administered 2023-10-02: 50 mg via INTRAVENOUS

## 2023-10-02 MED ORDER — BUPIVACAINE LIPOSOME 1.3 % IJ SUSP: 20.0000 mL | Freq: Once | INTRAMUSCULAR | Status: DC

## 2023-10-02 MED ORDER — ORAL CARE MOUTH RINSE: 15.0000 mL | Freq: Once | OROMUCOSAL | Status: AC

## 2023-10-02 SURGICAL SUPPLY — 34 items
BAG COUNTER SPONGE SURGICOUNT (BAG) IMPLANT
BENZOIN TINCTURE PRP APPL 2/3 (GAUZE/BANDAGES/DRESSINGS) ×3 IMPLANT
BLADE SURG 15 STRL LF DISP TIS (BLADE) ×1 IMPLANT
BRIEF MESH DISP LRG (UNDERPADS AND DIAPERS) ×3 IMPLANT
CNTNR URN SCR LID CUP LEK RST (MISCELLANEOUS) ×3 IMPLANT
COVER SURGICAL LIGHT HANDLE (MISCELLANEOUS) ×3 IMPLANT
DRAPE LAPAROTOMY T 102X78X121 (DRAPES) ×3 IMPLANT
ELECT NDL BLADE 2-5/6 (NEEDLE) ×2 IMPLANT
ELECT NEEDLE BLADE 2-5/6 (NEEDLE) ×2
ELECT REM PT RETURN 15FT ADLT (MISCELLANEOUS) ×3 IMPLANT
GAUZE 4X4 16PLY ~~LOC~~+RFID DBL (SPONGE) ×3 IMPLANT
GAUZE PAD ABD 8X10 STRL (GAUZE/BANDAGES/DRESSINGS) IMPLANT
GAUZE SPONGE 4X4 12PLY STRL (GAUZE/BANDAGES/DRESSINGS) ×1 IMPLANT
GLOVE BIO SURGEON STRL SZ7.5 (GLOVE) ×3 IMPLANT
GLOVE INDICATOR 8.0 STRL GRN (GLOVE) ×3 IMPLANT
GOWN STRL REUS W/ TWL XL LVL3 (GOWN DISPOSABLE) ×6 IMPLANT
KIT BASIN OR (CUSTOM PROCEDURE TRAY) ×3 IMPLANT
KIT TURNOVER KIT A (KITS) ×1 IMPLANT
LOOP VESSEL MAXI BLUE (MISCELLANEOUS) ×1 IMPLANT
NDL HYPO 22X1.5 SAFETY MO (MISCELLANEOUS) ×2 IMPLANT
NEEDLE HYPO 22X1.5 SAFETY MO (MISCELLANEOUS) ×2
PACK BASIC VI WITH GOWN DISP (CUSTOM PROCEDURE TRAY) ×3 IMPLANT
PENCIL SMOKE EVACUATOR (MISCELLANEOUS) IMPLANT
SHEARS HARMONIC 9CM CVD (BLADE) IMPLANT
SPIKE FLUID TRANSFER (MISCELLANEOUS) ×3 IMPLANT
SURGILUBE 2OZ TUBE FLIPTOP (MISCELLANEOUS) ×3 IMPLANT
SUT CHROMIC 2 0 SH (SUTURE) ×3 IMPLANT
SUT CHROMIC 3 0 SH 27 (SUTURE) IMPLANT
SUT VIC AB 2-0 SH 27X BRD (SUTURE) IMPLANT
SUT VIC AB 2-0 UR6 27 (SUTURE) ×18 IMPLANT
SYR 20ML LL LF (SYRINGE) ×3 IMPLANT
SYR 3ML LL SCALE MARK (SYRINGE) IMPLANT
TOWEL OR 17X26 10 PK STRL BLUE (TOWEL DISPOSABLE) ×3 IMPLANT
TOWEL OR NON WOVEN STRL DISP B (DISPOSABLE) ×3 IMPLANT

## 2023-10-02 NOTE — Anesthesia Procedure Notes (Signed)
Procedure Name: Intubation Date/Time: 10/02/2023 2:10 PM  Performed by: Cleda Clarks, CRNAPre-anesthesia Checklist: Patient identified, Emergency Drugs available, Suction available and Patient being monitored Patient Re-evaluated:Patient Re-evaluated prior to induction Oxygen Delivery Method: Circle system utilized Preoxygenation: Pre-oxygenation with 100% oxygen Induction Type: IV induction Ventilation: Mask ventilation without difficulty Laryngoscope Size: Glidescope and 3 Tube type: Oral Number of attempts: 1 Airway Equipment and Method: Video-laryngoscopy Placement Confirmation: ETT inserted through vocal cords under direct vision, positive ETCO2 and breath sounds checked- equal and bilateral Secured at: 21 cm Tube secured with: Tape Dental Injury: Teeth and Oropharynx as per pre-operative assessment

## 2023-10-02 NOTE — Transfer of Care (Signed)
Immediate Anesthesia Transfer of Care Note  Patient: Kelsey Daniels  Procedure(s) Performed: ANAL  FISTULOTOMY, SURGICAL (Rectum) ANORECTAL EXAM UNDER ANESTHESIA (Rectum)  Patient Location: PACU  Anesthesia Type:General  Level of Consciousness: awake, alert , and oriented  Airway & Oxygen Therapy: Patient Spontanous Breathing and Patient connected to face mask oxygen  Post-op Assessment: Report given to RN and Post -op Vital signs reviewed and stable  Post vital signs: Reviewed and stable  Last Vitals:  Vitals Value Taken Time  BP    Temp    Pulse 86 10/02/23 1502  Resp 12 10/02/23 1502  SpO2 100 % 10/02/23 1502  Vitals shown include unfiled device data.  Last Pain:  Vitals:   10/02/23 1202  PainSc: 0-No pain      Patients Stated Pain Goal: 4 (10/02/23 1202)  Complications: No notable events documented.

## 2023-10-02 NOTE — H&P (Signed)
CC: Here today for surgery  HPI: Kelsey Daniels is an 52 y.o. female with history of HTN, GERD, DM, whom is seen in the office today for evaluation of now suspected anal fistula  Previously saw our PA, 08/18/2023, for evaluation of a possible left lateral perianal abscess. This was noted to have first arisen following a colonoscopy 04/2023 with Dr. Levora Daniels. In our office visit, she was noted to have a 1 x 1 cm area of granulation tissue at the skin surface. There was no evidence of any active abscess however. Concern existed for a possible perianal fistula. She was subsequently set up to see me for further evaluation.  Per Kelsey Daniels note, the colonoscopy was somewhat difficult due to a tortuous colon and a few polyps and internal hemorrhoids were found. The polyps were benign. A perianal abscess was found on exam and she was prescribed Augmentin.  Copy of the colonoscopy also reviewed-04/26/2023, Dr. Levora Daniels: - Perianal abscess on perianal exam. - Examined portion of ileum normal - A single 8 mm polyp in the cecum was removed - Diverticulosis in sigmoid and ascending colon. - Two 5 to 7 mm polyps in the rectum, removed. - Internal hemorrhoids.  I reviewed pictures of the photo and agree it looks as though this may be the external opening of the fistula with chronic appearing granulation type tissue as opposed to an acute anal abscess.  She reports that all of her symptoms of burning/drainage all began around the time of a colonoscopy prep. Following this she has had persistent drainage now for 4 months. She denies any history of severe perianal pain that erupted and subsequently drained. This all seem to come about as some mild discomfort and then drainage. She will have some occasional bleeding when wiping coming from this evident perianal wound.  She denies any changes in health or health history since we met in the office. No new medications/allergies. She states she is ready for  surgery today.  Past Medical History:  Diagnosis Date   Anxiety    Family history of adverse reaction to anesthesia    sister PONV   Family history of breast cancer    Gallstones    GERD (gastroesophageal reflux disease)    Hypertension    Sleep apnea    cpap    Past Surgical History:  Procedure Laterality Date   CHOLECYSTECTOMY N/A 12/08/2022   Procedure: LAPAROSCOPIC CHOLECYSTECTOMY;  Surgeon: Kelsey Miyamoto, MD;  Location: [facility];  Service: General;  Laterality: N/A;   NASAL SEPTUM SURGERY      Family History  Problem Relation Age of Onset   Breast cancer Sister 11   Breast cancer Sister 67   BRCA 1/2 Sister        BRCA2+   Breast cancer Paternal Aunt    Breast cancer Maternal Grandmother    Breast cancer Paternal Grandmother     Social:  reports that she has quit smoking. Her smoking use included cigarettes. She has never used smokeless tobacco. She reports that she does not drink alcohol and does not use drugs.  Allergies:  Allergies  Allergen Reactions   Neomycin Rash   Sulfa Antibiotics Rash    Medications: I have reviewed the patient's current medications.  No results found for this or any previous visit (from the past 48 hour(s)).  No results found.   PE There were no vitals taken for this visit. Constitutional: NAD; conversant Eyes: Moist conjunctiva; no lid lag; anicteric Lungs: Normal  respiratory effort CV: RRR Psychiatric: Appropriate affect  No results found for this or any previous visit (from the past 48 hour(s)).  No results found.  A/P: Kelsey Daniels is an 52 y.o. female with hx of HTN, GERD, DM here for surgery for her suspected anal fistula  -The anatomy and physiology of the anal canal was discussed with the patient with associated pictures. The pathophysiology of anal fistula and abscess was discussed at length with associated pictures and illustrations using the ASCRS trifold handout  -We have reviewed  options going forward including further observation vs surgery -surgical treatment of anal fistula-intersphincteric vs transsphincteric with anorectal exam under anesthesia; possible draining seton versus fistulotomy based upon intraoperative findings. -The planned procedure, material risks (including, but not limited to, pain, bleeding, infection, scarring, need for blood transfusion, damage to anal sphincter, incontinence of gas and/or stool, need for additional procedures, anal stenosis, rare cases of pelvic sepsis which in severe cases may require things like a colostomy, recurrence, pneumonia, heart attack, stroke, death) benefits and alternatives to surgery were discussed at length. I noted a good probability that the procedure would help improve her symptoms. The patient's questions were answered to her satisfaction, she voiced understanding and elected to proceed with surgery. Additionally, we discussed typical postoperative expectations and the recovery process - including possibility of multiple surgeries to address. We also reviewed general expectations with draining seton(s), that if placed are generally in place for a couple of months and rational in their use.  Kelsey Olp, MD [facility]

## 2023-10-02 NOTE — Op Note (Signed)
10/02/2023  2:32 PM  PATIENT:  Kelsey Daniels  52 y.o. female  Patient Care Team: Debroah Loop, DO as PCP - General (Family Medicine)  PRE-OPERATIVE DIAGNOSIS:  Anal fistula  POST-OPERATIVE DIAGNOSIS:  Intersphincteric anal fistula  PROCEDURE:   Surgical treatment of intersphincteric anal fistula with fistulotomy Anorectal exam under anesthesia  SURGEON:  Surgeon(s): Andria Meuse, MD  ASSISTANT: OR Staff   ANESTHESIA:   local and general  SPECIMEN:  No Specimen  DISPOSITION OF SPECIMEN:  N/A  COUNTS:  Sponge, needle, and instrument counts were reported correct x2 at conclusion.  EBL: 5 mL  PLAN OF CARE: Discharge to home after PACU  PATIENT DISPOSITION:  PACU - hemodynamically stable.  OR FINDINGS: Left lateral anal fistula-intersphincteric.  This involved a relatively small amount of internal anal sphincter muscle, less than 10%.  There is no external sphincter muscle involvement.  This was therefore felt to be amenable to a primary fistulotomy.  DESCRIPTION: The patient was identified in the preoperative holding area and taken to the OR where she was placed on the operating room table. SCDs were placed.  General anesthesia was induced without difficulty. The patient was then positioned in high lithotomy with Allen stirrups. Pressure points were then evaluated and padded.  She was then prepped and draped in usual sterile fashion.  A surgical timeout was performed indicating the correct patient, procedure, and positioning.  A perianal block was performed using a dilute mixture of 0.25% Marcaine with epinephrine and Exparel.   After ascertaining that an appropriate level of anesthesia had been achieved, a well lubricated digital rectal exam was performed. This demonstrated good tone.  No palpable masses..  A Hill-Ferguson anoscope was into the anal canal and circumferential inspection demonstrated healthy appearing anoderm.  Healthy appearing distal rectal mucosa.   No evident fissures.  No significant hemorrhoidal components.  She does have some small external hemorrhoidal tags.  Externally, in the left lateral position, there is heaped up granulation tissue with a punctate opening consistent in appearance with an external opening to an anal fistula.  This is then carefully cannulated with a semirigid fistula probe taking care to prevent creating any false passages.  The internal opening is easily identified distal to the dentate line.  The fistula is intersphincteric in nature valving less than 10% of the internal sphincter muscle.  There is no involvement of the external sphincter muscle.  This is therefore felt to be amenable to an upfront fistulotomy.  The skin around the planned fistulotomy site is further anesthetized.  The skin is then incised sharply.  The fistula tract is opened over the probe.  The underlying granulation tissue was then fulgurated.  Chronic scarlike tissue within the fistula tract is carefully fulgurated.  The area was irrigated.  Hemostasis is verified.  Additional local anesthetic was infiltrated.  Topical dibucaine is applied.  All sponge, needle, and instrument counts are reported correct.  A dressing consisting of 4 x 4's, ABD, mesh underwear was ultimately placed.  She was taken out of lithotomy position, awakened from anesthesia, extubated, and transferred to a stretcher for transport to recovery in satisfactory condition.  DISPOSITION: PACU in satisfactory condition.

## 2023-10-02 NOTE — Discharge Instructions (Addendum)
ANORECTAL SURGERY: POST OP INSTRUCTIONS  DIET: Follow a light bland diet the first 24 hours after arrival home, such as soup, liquids, crackers, etc.  Be sure to include lots of fluids daily.  Avoid fast food or heavy meals as your are more likely to get nauseated.  Eat a low fat diet the next few days after surgery.   Some bleeding with bowel movements is expected for the first couple of days but this should stop in between bowel movements  Take your usually prescribed home medications unless otherwise directed. No foreign bodies per rectum for the next 3 months (enemas, etc)  PAIN CONTROL: It is helpful to take an over-the-counter pain medication regularly for the first few days/weeks.  Choose from the following that works best for you: Ibuprofen (Advil, etc) Three 200mg tabs every 6 hours as needed. Acetaminophen (Tylenol, etc) 500-650mg every 6 hours as needed NOTE: You may take both of these medications together - most patients find it most helpful when alternating between the two (i.e. Ibuprofen at 6am, tylenol at 9am, ibuprofen at 12pm ...) A  prescription for pain medication may have been prescribed for you at discharge.  Take your pain medication as prescribed.  If you are having problems/concerns with the prescription medicine, please call us for further advice.  Avoid getting constipated.  Between the surgery and the pain medications, it is common to experience some constipation.  Increasing fluid intake (64oz of water per day) and taking a fiber supplement (such as Metamucil, Citrucel, FiberCon) 1-2 times a day regularly will usually help prevent this problem from occurring.  Take Miralax (over the counter) 1-2x/day while taking a narcotic pain medication. If no bowel movement after 48hours, you may additionally take a laxative like a bottle of Milk of Magnesia which can be purchased over the counter. Avoid enemas.   Watch out for diarrhea.  If you have many loose bowel movements,  simplify your diet to bland foods.  Stop any stool softeners and decrease your fiber supplement. If this worsens or does not improve, please call us.  Wash / shower every day.  If you were discharged with a dressing, you may remove this the day after your surgery. You may shower normally, getting soap/water on your wound, particularly after bowel movements.  Soaking in a warm bath filled a couple inches ("Sitz bath") is a great way to clean the area after a bowel movement and many patients find it is a way to soothe the area.  ACTIVITIES as tolerated:   You may resume regular (light) daily activities beginning the next day--such as daily self-care, walking, climbing stairs--gradually increasing activities as tolerated.  If you can walk 30 minutes without difficulty, it is safe to try more intense activity such as jogging, treadmill, bicycling, low-impact aerobics, etc. Refrain from any heavy lifting or straining for the first 2 weeks after your procedure, particularly if your surgery was for hemorrhoids. Avoid activities that make your pain worse You may drive when you are no longer taking prescription pain medication, you can comfortably wear a seatbelt, and you can safely maneuver your car and apply brakes.  FOLLOW UP in our office Please call [facility] at [phone] to set up an appointment to see your surgeon in the office for a follow-up appointment approximately 2 weeks after your surgery. Make sure that you call for this appointment the day you arrive home to insure a convenient appointment time.  9. If you have disability or family leave forms   that need to be completed, you may have them completed by your primary care physician's office; for return to work instructions, please ask our office staff and they will be happy to assist you in obtaining this documentation   When to call us [phone]: Poor pain control Reactions / problems with new medications (rash/itching, etc)  Fever over  101.5 F (38.5 C) Inability to urinate Nausea/vomiting Worsening swelling or bruising Continued bleeding from incision. Increased pain, redness, or drainage from the incision  The clinic staff is available to answer your questions during regular business hours (8:30am-5pm).  Please don't hesitate to call and ask to speak to one of our nurses for clinical concerns.   A surgeon from [facility] is always on call at the hospitals   If you have a medical emergency, go to the nearest emergency room or call 911.   [facility] [address], [address], [place], [place]  [address] MAIN: [phone] FAX: [phone] [email or web address] 

## 2023-10-02 NOTE — Anesthesia Preprocedure Evaluation (Signed)
Anesthesia Evaluation  Patient identified by MRN, date of birth, ID band Patient awake    Reviewed: Allergy & Precautions, H&P , NPO status , Patient's Chart, lab work & pertinent test results  Airway Mallampati: II   Neck ROM: full    Dental   Pulmonary sleep apnea , former smoker   breath sounds clear to auscultation       Cardiovascular hypertension,  Rhythm:regular Rate:Normal     Neuro/Psych   Anxiety        GI/Hepatic ,GERD  ,,  Endo/Other    Renal/GU      Musculoskeletal   Abdominal   Peds  Hematology   Anesthesia Other Findings   Reproductive/Obstetrics                             Anesthesia Physical Anesthesia Plan  ASA: 2  Anesthesia Plan: General   Post-op Pain Management:    Induction: Intravenous  PONV Risk Score and Plan: 3 and Ondansetron, Dexamethasone, Midazolam and Treatment may vary due to age or medical condition  Airway Management Planned: Oral ETT  Additional Equipment:   Intra-op Plan:   Post-operative Plan: Extubation in OR  Informed Consent: I have reviewed the patients History and Physical, chart, labs and discussed the procedure including the risks, benefits and alternatives for the proposed anesthesia with the patient or authorized representative who has indicated his/her understanding and acceptance.     Dental advisory given  Plan Discussed with: CRNA, Anesthesiologist and Surgeon  Anesthesia Plan Comments:        Anesthesia Quick Evaluation

## 2023-10-03 ENCOUNTER — Encounter (HOSPITAL_COMMUNITY): Payer: Self-pay | Admitting: Surgery

## 2023-10-03 NOTE — Anesthesia Postprocedure Evaluation (Signed)
Anesthesia Post Note  Patient: Kelsey Daniels  Procedure(s) Performed: ANAL  FISTULOTOMY, SURGICAL (Rectum) ANORECTAL EXAM UNDER ANESTHESIA (Rectum)     Patient location during evaluation: PACU Anesthesia Type: General Level of consciousness: awake and alert Pain management: pain level controlled Vital Signs Assessment: post-procedure vital signs reviewed and stable Respiratory status: spontaneous breathing, nonlabored ventilation, respiratory function stable and patient connected to nasal cannula oxygen Cardiovascular status: blood pressure returned to baseline and stable Postop Assessment: no apparent nausea or vomiting Anesthetic complications: no   No notable events documented.  Last Vitals:  Vitals:   10/02/23 1545 10/02/23 1600  BP: 106/67 115/66  Pulse: 74 79  Resp: 14 15  Temp:  36.7 C  SpO2: 95% 94%    Last Pain:  Vitals:   10/02/23 1600  TempSrc:   PainSc: 0-No pain                 Simcha Farrington S

## 2023-10-05 ENCOUNTER — Encounter: Payer: Self-pay | Admitting: Emergency Medicine

## 2023-10-05 ENCOUNTER — Ambulatory Visit
Admission: EM | Admit: 2023-10-05 | Discharge: 2023-10-05 | Disposition: A | Discharge status: Home / Self Care | Payer: BC Managed Care – PPO

## 2023-10-05 DIAGNOSIS — I10 Essential (primary) hypertension: Secondary | ICD-10-CM | POA: Diagnosis not present

## 2023-10-05 NOTE — ED Triage Notes (Signed)
Pt reports she had anorectal surgery x 4 day ago and since pt has noticed her blood pressure elevated and thinks it might be due to her anxiety.

## 2023-10-05 NOTE — ED Provider Notes (Signed)
[facility]    CSN: [id number] Arrival date & time: 10/05/23  1427      History   Chief Complaint Chief Complaint  Patient presents with   Hypertension    HPI Kelsey Daniels is a 52 y.o. female.   Patient presents with concerns of elevated blood pressure for the past few days. She reports long history of hypertension, usually well controlled with her atenolol and triamterene-hydrochlorothiazide. The patient had surgery for an anal fistula on Monday and reports some increased anxiety lately. She has history of anxiety but hasn't wanted to take her Xanax too much. She reports her blood pressure is usually normal, but lately has been 140s/80s and states the highest she has seen the past few days was the reading here in clinic. She denies any headache, dizziness, vision changes, or chest pain.   The history is provided by the patient.  Hypertension Pertinent negatives include no chest pain, no headaches and no shortness of breath.    Past Medical History:  Diagnosis Date   Anxiety    Family history of adverse reaction to anesthesia    sister PONV   Family history of breast cancer    Gallstones    GERD (gastroesophageal reflux disease)    Hypertension    Sleep apnea    cpap    Patient Active Problem List   Diagnosis Date Noted   Genetic testing 06/28/2021   Family history of breast cancer 06/03/2021   Family history of BRCA2 gene positive 06/03/2021    Past Surgical History:  Procedure Laterality Date   CHOLECYSTECTOMY N/A 12/08/2022   Procedure: LAPAROSCOPIC CHOLECYSTECTOMY;  Surgeon: Abigail Miyamoto, MD;  Location: [facility];  Service: General;  Laterality: N/A;   FISTULOTOMY N/A 10/02/2023   Procedure: ANAL  FISTULOTOMY, SURGICAL;  Surgeon: Andria Meuse, MD;  Location: [facility];  Service: General;  Laterality: N/A;  61   NASAL SEPTUM SURGERY     RECTAL EXAM UNDER ANESTHESIA N/A 10/02/2023   Procedure: ANORECTAL EXAM UNDER  ANESTHESIA;  Surgeon: Andria Meuse, MD;  Location: [facility];  Service: General;  Laterality: N/A;    OB History   No obstetric history on file.      Home Medications    Prior to Admission medications   Medication Sig Start Date End Date Taking? Authorizing Provider  ALPRAZolam Prudy Feeler) 0.5 MG tablet Take 0.5 mg by mouth at bedtime as needed for anxiety.    [provider]  atenolol (TENORMIN) 50 MG tablet Take 50 mg by mouth daily.    [provider]  famotidine (PEPCID) 20 MG tablet Take 40 mg by mouth 2 (two) times daily.    [provider]  FLUoxetine (PROZAC) 40 MG capsule Take 40 mg by mouth daily.    [provider]  medroxyPROGESTERone Acetate 150 MG/ML SUSY USE AS DIRECTED TO  INJECT  INTRAMUSCULARLY  EVERY  3  MONTHS    [provider]  omeprazole (PRILOSEC) 40 MG capsule Take 40 mg by mouth daily.    [provider]  oxyCODONE (ROXICODONE) 5 MG immediate release tablet Take 1 tablet (5 mg total) by mouth every 8 (eight) hours as needed for up to 5 days (postop pain not controlled with tylenol and ibuprofen first). 10/02/23 10/07/23  Andria Meuse, MD  triamterene-hydrochlorothiazide (MAXZIDE-25) 37.5-25 MG tablet Take 1 tablet by mouth daily.    [provider]    Family History Family History  Problem Relation Age  of Onset   Breast cancer Sister 39   Breast cancer Sister 22   BRCA 1/2 Sister        BRCA2+   Breast cancer Paternal Aunt    Breast cancer Maternal Grandmother    Breast cancer Paternal Grandmother     Social History Social History   Tobacco Use   Smoking status: Former    Types: Cigarettes   Smokeless tobacco: Never   Tobacco comments:    Quit 20 years ago  Vaping Use   Vaping status: Never Used  Substance Use Topics   Alcohol use: Never   Drug use: Never     Allergies   Neomycin and Sulfa antibiotics   Review of Systems Review of Systems  Constitutional:  Negative  for fever.  Eyes:  Negative for visual disturbance.  Respiratory:  Negative for chest tightness and shortness of breath.   Cardiovascular:  Negative for chest pain.  Neurological:  Negative for dizziness and headaches.     Physical Exam Triage Vital Signs ED Triage Vitals  Encounter Vitals Group     BP 10/05/23 1540 (!) 148/86     Systolic BP Percentile --      Diastolic BP Percentile --      Pulse Rate 10/05/23 1540 89     Resp 10/05/23 1540 18     Temp 10/05/23 1540 98.7 F (37.1 C)     Temp Source 10/05/23 1540 Oral     SpO2 10/05/23 1540 98 %     Weight --      Height --      Head Circumference --      Peak Flow --      Pain Score 10/05/23 1539 0     Pain Loc --      Pain Education --      Exclude from Growth Chart --    No data found.  Updated Vital Signs BP (!) 148/86 (BP Location: Right Arm)   Pulse 89   Temp 98.7 F (37.1 C) (Oral)   Resp 18   SpO2 98%   Visual Acuity Right Eye Distance:   Left Eye Distance:   Bilateral Distance:    Right Eye Near:   Left Eye Near:    Bilateral Near:     Physical Exam Vitals and nursing note reviewed.  Constitutional:      General: She is not in acute distress. HENT:     Head: Normocephalic.  Eyes:     Pupils: Pupils are equal, round, and reactive to light.  Cardiovascular:     Rate and Rhythm: Normal rate and regular rhythm.     Heart sounds: Normal heart sounds.  Pulmonary:     Effort: Pulmonary effort is normal.     Breath sounds: Normal breath sounds.  Neurological:     Mental Status: She is alert.  Psychiatric:        Mood and Affect: Mood normal.      UC Treatments / Results  Labs (all labs ordered are listed, but only abnormal results are displayed) Labs Reviewed - No data to display  EKG   Radiology No results found.  Procedures Procedures (including critical care time)  Medications Ordered in UC Medications - No data to display  Initial Impression / Assessment and Plan / UC  Course  I have reviewed the triage vital signs and the nursing notes.  Pertinent labs & imaging results that were available during my care of the patient were reviewed  by me and considered in my medical decision making (see chart for details).     Reassurance regarding transient increases in BP due to stress from surgery or anxiety. BP mildly elevated but not significantly high enough to require acute treatment. Discussed follow-up with PCP and ER precautions.   E/M: 1 chronic illness with exacerbation, no data, low risk   Final Clinical Impressions(s) / UC Diagnoses   Final diagnoses:  Essential hypertension     Discharge Instructions      It can be normal to have mild elevations in blood pressure as your body recovers from surgery, or due to anxiety. Recommend rest and keeping hydrated, and continue to monitor your blood pressure. Follow-up with PCP if continues to be elevated in a week or so. Go to the ER if develop severely high blood pressure such as >200/>120, or severe headache or vision changes.     ED Prescriptions   None    PDMP not reviewed this encounter.   Estanislado Pandy, [place] 10/05/23 [phone]

## 2023-10-05 NOTE — Discharge Instructions (Signed)
It can be normal to have mild elevations in blood pressure as your body recovers from surgery, or due to anxiety. Recommend rest and keeping hydrated, and continue to monitor your blood pressure. Follow-up with PCP if continues to be elevated in a week or so. Go to the ER if develop severely high blood pressure such as >200/>120, or severe headache or vision changes.

## 2023-10-09 ENCOUNTER — Ambulatory Visit (HOSPITAL_BASED_OUTPATIENT_CLINIC_OR_DEPARTMENT_OTHER)
Admission: RE | Admit: 2023-10-09 | Discharge: 2023-10-09 | Disposition: A | Discharge status: Home / Self Care | Payer: BC Managed Care – PPO | Source: Ambulatory Visit | Attending: Family Medicine | Admitting: Family Medicine

## 2023-10-09 ENCOUNTER — Encounter (HOSPITAL_BASED_OUTPATIENT_CLINIC_OR_DEPARTMENT_OTHER): Payer: Self-pay | Admitting: Radiology

## 2023-10-09 DIAGNOSIS — Z1231 Encounter for screening mammogram for malignant neoplasm of breast: Secondary | ICD-10-CM | POA: Insufficient documentation

## 2023-10-13 DIAGNOSIS — F411 Generalized anxiety disorder: Secondary | ICD-10-CM | POA: Diagnosis not present

## 2023-10-14 DIAGNOSIS — G4733 Obstructive sleep apnea (adult) (pediatric): Secondary | ICD-10-CM | POA: Diagnosis not present

## 2023-11-14 DIAGNOSIS — G4733 Obstructive sleep apnea (adult) (pediatric): Secondary | ICD-10-CM | POA: Diagnosis not present

## 2023-11-15 DIAGNOSIS — Z3042 Encounter for surveillance of injectable contraceptive: Secondary | ICD-10-CM | POA: Diagnosis not present

## 2024-01-24 DIAGNOSIS — R9431 Abnormal electrocardiogram [ECG] [EKG]: Secondary | ICD-10-CM | POA: Diagnosis not present

## 2024-01-24 DIAGNOSIS — R110 Nausea: Secondary | ICD-10-CM | POA: Diagnosis not present

## 2024-01-24 DIAGNOSIS — M25512 Pain in left shoulder: Secondary | ICD-10-CM | POA: Diagnosis not present

## 2024-01-24 DIAGNOSIS — R251 Tremor, unspecified: Secondary | ICD-10-CM | POA: Diagnosis not present

## 2024-01-25 DIAGNOSIS — I10 Essential (primary) hypertension: Secondary | ICD-10-CM | POA: Diagnosis not present

## 2024-01-25 DIAGNOSIS — E118 Type 2 diabetes mellitus with unspecified complications: Secondary | ICD-10-CM | POA: Diagnosis not present

## 2024-01-25 DIAGNOSIS — E782 Mixed hyperlipidemia: Secondary | ICD-10-CM | POA: Diagnosis not present

## 2024-01-25 DIAGNOSIS — Z0000 Encounter for general adult medical examination without abnormal findings: Secondary | ICD-10-CM | POA: Diagnosis not present

## 2024-02-07 DIAGNOSIS — Z3042 Encounter for surveillance of injectable contraceptive: Secondary | ICD-10-CM | POA: Diagnosis not present

## 2024-04-20 NOTE — Progress Notes (Unsigned)
 Cardiology Office Note:   Date:  04/25/2024  ID:  Kelsey Daniels, DOB 1970-11-22, MRN [id number] PCP:  Kelsey Opal, DO  [facility] Providers Cardiologist:  Kelsey Haws, MD Referring MD: Kelsey Opal, DO  Chief Complaint/Reason for Referral:  Abnormal EKG ASSESSMENT:    1. Nonspecific abnormal electrocardiogram (ECG) (EKG)   2. Primary hypertension   3. BMI 31.0-31.9,adult   4. OSA on CPAP     PLAN:   In order of problems listed above: Abnormal EKG: EKG today demonstrates normal sinus rhythm without acute ischemic changes.  Patient's chest pain syndrome seems atypical and likely due to a musculoskeletal etiology due to improvement with anti-inflammatories.  Will refer for calcium score CT.  Will keep follow-up with me open-ended. Hypertension: Continue triamterene hydrochlorothiazide 37.5 x 25 mg daily and atenolol 50 mg daily. Elevated BMI: Prediabetic per PCP; continue diet and exercise modification. OSA: Continue CPAP.            Dispo:  Return if symptoms worsen or fail to improve.      Medication Adjustments/Labs and Tests Ordered: Current medicines are reviewed at length with the patient today.  Concerns regarding medicines are outlined above.  The following changes have been made:  no change   Labs/tests ordered: Orders Placed This Encounter  Procedures   CT CARDIAC SCORING   EKG 12-Lead    Medication Changes: No orders of the defined types were placed in this encounter.   Current medicines are reviewed at length with the patient today.  The patient does not have concerns regarding medicines.     History of Present Illness:    FOCUSED PROBLEM LIST:   Hypertension OSA On CPAP GERD Prediabetes CKD II BMI 31  June 2025:  Patient consents to use of AI scribe. The patient is a 53 year old female with the above listed problems for recommendations regarding an abnormal EKG.  The patient was seen by her primary care provider in March.  She  was having left shoulder pain and the EKG was performed thought to be abnormal.  That EKG is not available for review.  In March, she experienced severe shoulder pain localized to the tip, accompanied by significant gastrointestinal symptoms, including stomach discomfort and weakness. The shoulder pain was severe enough to require ibuprofen, after which she went to sleep and awoke feeling fine. The pain did not radiate and was not exacerbated by exertion, as she was mostly resting during the episode. The symptoms resolved by the next morning, although she felt weak and dehydrated.  She has a history of hypertension, managed with medication, and sleep apnea, managed with CPAP. She also has GERD, for which she is on medication. Previously identified as prediabetic, she has controlled her blood sugar levels through significant weight loss and dietary changes. She underwent recent gallbladder removal.  Her family history is significant for cardiac issues, with her mother having required nitroglycerin and one sister having had a heart attack. She quit smoking 20 years ago and does not currently smoke. She engages in regular physical activity by walking her dog in the neighborhood, which includes some hills, without experiencing chest pain, shortness of breath, or shoulder pain.  No current shoulder pain, chest pain, shortness of breath, lightheadedness, or blacking out spells. No issues with breathing while lying flat.     Current Medications: Current Meds  Medication Sig   ALPRAZolam (XANAX) 0.5 MG tablet Take 0.5 mg by mouth at bedtime as needed for anxiety.   atenolol (TENORMIN) 50  MG tablet Take 50 mg by mouth daily.   famotidine (PEPCID) 40 MG tablet Take 40 mg by mouth 2 (two) times daily.   FLUoxetine (PROZAC) 40 MG capsule Take 40 mg by mouth daily.   medroxyPROGESTERone  Acetate 150 MG/ML SUSY USE AS DIRECTED TO  INJECT  INTRAMUSCULARLY  EVERY  3  MONTHS   omeprazole (PRILOSEC) 40 MG capsule  Take 40 mg by mouth daily.   ondansetron  (ZOFRAN -ODT) 4 MG disintegrating tablet 1-2 tablets on the tongue and allow to dissolve Orally every 8 hours as needed for Nausea or Vomiting   triamterene-hydrochlorothiazide (MAXZIDE-25) 37.5-25 MG tablet Take 1 tablet by mouth daily.     Review of Systems:   Please see the history of present illness.    All other systems reviewed and are negative.     EKGs/Labs/Other Test Reviewed:   EKG: 2024 normal sinus rhythm  EKG Interpretation Date/Time:  Thursday April 25 2024 09:44:11 EDT Ventricular Rate:  73 PR Interval:  168 QRS Duration:  100 QT Interval:  392 QTC Calculation: 431 R Axis:   61  Text Interpretation: Normal sinus rhythm Normal ECG When compared with ECG of 06-Dec-2022 07:43, No significant change was found Confirmed by Kelsey Daniels ([id number]) on 04/25/2024 10:02:56 AM         Risk Assessment/Calculations:          Physical Exam:   VS:  BP 138/82   Pulse 76   Ht 5' 4 (1.626 m)   Wt 182 lb (82.6 kg)   SpO2 97%   BMI 31.24 kg/m        Wt Readings from Last 3 Encounters:  04/25/24 182 lb (82.6 kg)  10/02/23 185 lb (83.9 kg)  09/20/23 185 lb (83.9 kg)      GENERAL:  No apparent distress, AOx3 HEENT:  No carotid bruits, +2 carotid impulses, no scleral icterus CAR: RRR no murmurs, gallops, rubs, or thrills RES:  Clear to auscultation bilaterally ABD:  Soft, nontender, nondistended, positive bowel sounds x 4 VASC:  +2 radial pulses, +2 carotid pulses NEURO:  CN 2-12 grossly intact; motor and sensory grossly intact PSYCH:  No active depression or anxiety EXT:  No edema, ecchymosis, or cyanosis  Signed, Daniels Kelsey Wendel, MD  04/25/2024 10:33 AM    [facility] [address] [place], [place], [place]  [address] Phone: [phone]; Fax: [phone]   Note:  This document was prepared using Dragon voice recognition software and may include unintentional dictation errors.

## 2024-04-25 ENCOUNTER — Ambulatory Visit: Attending: Internal Medicine | Admitting: Internal Medicine

## 2024-04-25 ENCOUNTER — Encounter: Payer: Self-pay | Admitting: Internal Medicine

## 2024-04-25 VITALS — BP 138/82 | HR 76 | Ht 64.0 in | Wt 182.0 lb

## 2024-04-25 DIAGNOSIS — R9431 Abnormal electrocardiogram [ECG] [EKG]: Secondary | ICD-10-CM | POA: Diagnosis not present

## 2024-04-25 DIAGNOSIS — G4733 Obstructive sleep apnea (adult) (pediatric): Secondary | ICD-10-CM | POA: Diagnosis not present

## 2024-04-25 DIAGNOSIS — Z6831 Body mass index (BMI) 31.0-31.9, adult: Secondary | ICD-10-CM | POA: Diagnosis not present

## 2024-04-25 DIAGNOSIS — I10 Essential (primary) hypertension: Secondary | ICD-10-CM | POA: Diagnosis not present

## 2024-04-25 NOTE — Addendum Note (Signed)
 Addended by: Montague Corella on: 04/25/2024 10:45 AM   Modules accepted: Orders

## 2024-04-25 NOTE — Patient Instructions (Signed)
 Medication Instructions:  No changes *If you need a refill on your cardiac medications before your next appointment, please call your pharmacy*  Lab Work: none If you have labs (blood work) drawn today and your tests are completely normal, you will receive your results only by: MyChart Message (if you have MyChart) OR A paper copy in the mail If you have any lab test that is abnormal or we need to change your treatment, we will call you to review the results.  Testing/Procedures: Calcium Score CT Scan  Follow-Up: As needed

## 2024-04-29 ENCOUNTER — Ambulatory Visit (HOSPITAL_BASED_OUTPATIENT_CLINIC_OR_DEPARTMENT_OTHER)
Admission: RE | Admit: 2024-04-29 | Discharge: 2024-04-29 | Disposition: A | Discharge status: Home / Self Care | Payer: Self-pay | Source: Ambulatory Visit | Attending: Internal Medicine | Admitting: Internal Medicine

## 2024-04-29 DIAGNOSIS — R9431 Abnormal electrocardiogram [ECG] [EKG]: Secondary | ICD-10-CM | POA: Insufficient documentation

## 2024-04-29 DIAGNOSIS — S61210A Laceration without foreign body of right index finger without damage to nail, initial encounter: Secondary | ICD-10-CM | POA: Diagnosis not present

## 2024-04-30 ENCOUNTER — Ambulatory Visit: Payer: Self-pay | Admitting: Internal Medicine

## 2024-04-30 DIAGNOSIS — Z3042 Encounter for surveillance of injectable contraceptive: Secondary | ICD-10-CM | POA: Diagnosis not present

## 2024-05-09 DIAGNOSIS — S6991XS Unspecified injury of right wrist, hand and finger(s), sequela: Secondary | ICD-10-CM | POA: Diagnosis not present

## 2024-05-09 DIAGNOSIS — S61219A Laceration without foreign body of unspecified finger without damage to nail, initial encounter: Secondary | ICD-10-CM | POA: Diagnosis not present

## 2024-05-15 DIAGNOSIS — M20011 Mallet finger of right finger(s): Secondary | ICD-10-CM | POA: Diagnosis not present

## 2024-05-23 DIAGNOSIS — M20011 Mallet finger of right finger(s): Secondary | ICD-10-CM | POA: Diagnosis not present

## 2024-05-23 DIAGNOSIS — S66320A Laceration of extensor muscle, fascia and tendon of right index finger at wrist and hand level, initial encounter: Secondary | ICD-10-CM | POA: Diagnosis not present

## 2024-06-07 DIAGNOSIS — Z4789 Encounter for other orthopedic aftercare: Secondary | ICD-10-CM | POA: Diagnosis not present

## 2024-07-05 DIAGNOSIS — Z4789 Encounter for other orthopedic aftercare: Secondary | ICD-10-CM | POA: Diagnosis not present

## 2024-07-16 DIAGNOSIS — M25641 Stiffness of right hand, not elsewhere classified: Secondary | ICD-10-CM | POA: Diagnosis not present

## 2024-07-16 DIAGNOSIS — M20011 Mallet finger of right finger(s): Secondary | ICD-10-CM | POA: Diagnosis not present

## 2024-07-24 DIAGNOSIS — M20011 Mallet finger of right finger(s): Secondary | ICD-10-CM | POA: Diagnosis not present

## 2024-07-24 DIAGNOSIS — M25641 Stiffness of right hand, not elsewhere classified: Secondary | ICD-10-CM | POA: Diagnosis not present

## 2024-07-25 DIAGNOSIS — E118 Type 2 diabetes mellitus with unspecified complications: Secondary | ICD-10-CM | POA: Diagnosis not present

## 2024-07-25 DIAGNOSIS — Z3042 Encounter for surveillance of injectable contraceptive: Secondary | ICD-10-CM | POA: Diagnosis not present

## 2024-07-25 DIAGNOSIS — K219 Gastro-esophageal reflux disease without esophagitis: Secondary | ICD-10-CM | POA: Diagnosis not present

## 2024-07-25 DIAGNOSIS — E782 Mixed hyperlipidemia: Secondary | ICD-10-CM | POA: Diagnosis not present

## 2024-07-25 DIAGNOSIS — I10 Essential (primary) hypertension: Secondary | ICD-10-CM | POA: Diagnosis not present

## 2024-08-07 DIAGNOSIS — M25641 Stiffness of right hand, not elsewhere classified: Secondary | ICD-10-CM | POA: Diagnosis not present

## 2024-08-07 DIAGNOSIS — M20011 Mallet finger of right finger(s): Secondary | ICD-10-CM | POA: Diagnosis not present

## 2024-08-16 DIAGNOSIS — R29818 Other symptoms and signs involving the nervous system: Secondary | ICD-10-CM | POA: Diagnosis not present

## 2024-08-16 DIAGNOSIS — M25511 Pain in right shoulder: Secondary | ICD-10-CM | POA: Diagnosis not present

## 2024-08-16 DIAGNOSIS — M20011 Mallet finger of right finger(s): Secondary | ICD-10-CM | POA: Diagnosis not present

## 2024-08-16 DIAGNOSIS — I10 Essential (primary) hypertension: Secondary | ICD-10-CM | POA: Diagnosis not present

## 2024-08-16 DIAGNOSIS — Z4789 Encounter for other orthopedic aftercare: Secondary | ICD-10-CM | POA: Diagnosis not present

## 2024-09-12 ENCOUNTER — Other Ambulatory Visit: Payer: Self-pay | Admitting: Family Medicine

## 2024-09-12 DIAGNOSIS — Z1231 Encounter for screening mammogram for malignant neoplasm of breast: Secondary | ICD-10-CM

## 2024-10-09 ENCOUNTER — Ambulatory Visit
Admission: RE | Admit: 2024-10-09 | Discharge: 2024-10-09 | Disposition: A | Discharge status: Home / Self Care | Source: Ambulatory Visit

## 2024-10-09 DIAGNOSIS — Z1231 Encounter for screening mammogram for malignant neoplasm of breast: Secondary | ICD-10-CM

## 2024-10-16 DIAGNOSIS — Z3042 Encounter for surveillance of injectable contraceptive: Secondary | ICD-10-CM | POA: Diagnosis not present
